# Patient Record
Sex: Male | Born: 1935 | Race: White | Hispanic: No | Marital: Married | Smoking: Former smoker
Health system: Southern US, Community
[De-identification: ages and names within clinical notes are randomized; demographics above are authoritative.]

## PROBLEM LIST (undated history)

## (undated) DIAGNOSIS — N400 Enlarged prostate without lower urinary tract symptoms: Secondary | ICD-10-CM

## (undated) DIAGNOSIS — E782 Mixed hyperlipidemia: Secondary | ICD-10-CM

## (undated) DIAGNOSIS — E039 Hypothyroidism, unspecified: Secondary | ICD-10-CM

## (undated) HISTORY — DX: Hypothyroidism, unspecified: E03.9

## (undated) HISTORY — DX: Benign prostatic hyperplasia without lower urinary tract symptoms: N40.0

## (undated) HISTORY — DX: Mixed hyperlipidemia: E78.2

---

## 2007-02-08 ENCOUNTER — Ambulatory Visit (HOSPITAL_COMMUNITY): Admission: RE | Admit: 2007-02-08 | Discharge: 2007-02-08 | Payer: Self-pay | Admitting: Oncology

## 2007-02-13 ENCOUNTER — Ambulatory Visit: Admission: RE | Admit: 2007-02-13 | Discharge: 2007-05-10 | Payer: Self-pay | Admitting: *Deleted

## 2007-03-07 LAB — CBC WITH DIFFERENTIAL/PLATELET
BASO%: 0.2 % (ref 0.0–2.0)
HCT: 35.2 % — ABNORMAL LOW (ref 38.7–49.9)
HGB: 12.8 g/dL — ABNORMAL LOW (ref 13.0–17.1)
MCHC: 36.3 g/dL — ABNORMAL HIGH (ref 32.0–35.9)
MONO#: 0.1 10*3/uL (ref 0.1–0.9)
NEUT%: 69.7 % (ref 40.0–75.0)
WBC: 4.3 10*3/uL (ref 4.0–10.0)
lymph#: 1.1 10*3/uL (ref 0.9–3.3)

## 2007-03-15 LAB — CBC WITH DIFFERENTIAL/PLATELET
Basophils Absolute: 0 10*3/uL (ref 0.0–0.1)
EOS%: 3.3 % (ref 0.0–7.0)
Eosinophils Absolute: 0 10*3/uL (ref 0.0–0.5)
HCT: 30.8 % — ABNORMAL LOW (ref 38.7–49.9)
HGB: 11.2 g/dL — ABNORMAL LOW (ref 13.0–17.1)
MCH: 33.7 pg — ABNORMAL HIGH (ref 28.0–33.4)
MONO#: 0.4 10*3/uL (ref 0.1–0.9)
NEUT#: 0.1 10*3/uL — CL (ref 1.5–6.5)
NEUT%: 9.3 % — ABNORMAL LOW (ref 40.0–75.0)
lymph#: 0.8 10*3/uL — ABNORMAL LOW (ref 0.9–3.3)

## 2007-03-24 ENCOUNTER — Ambulatory Visit: Payer: Self-pay | Admitting: Oncology

## 2007-04-04 LAB — CBC WITH DIFFERENTIAL/PLATELET
Basophils Absolute: 0 10*3/uL (ref 0.0–0.1)
EOS%: 0.5 % (ref 0.0–7.0)
Eosinophils Absolute: 0.1 10*3/uL (ref 0.0–0.5)
HCT: 28.8 % — ABNORMAL LOW (ref 38.7–49.9)
HGB: 10.1 g/dL — ABNORMAL LOW (ref 13.0–17.1)
LYMPH%: 5.9 % — ABNORMAL LOW (ref 14.0–48.0)
MCH: 33.9 pg — ABNORMAL HIGH (ref 28.0–33.4)
MCV: 96.3 fL (ref 81.6–98.0)
MONO%: 9.3 % (ref 0.0–13.0)
NEUT%: 84.3 % — ABNORMAL HIGH (ref 40.0–75.0)
Platelets: 135 10*3/uL — ABNORMAL LOW (ref 145–400)

## 2007-06-22 ENCOUNTER — Ambulatory Visit: Payer: Self-pay | Admitting: Oncology

## 2008-04-09 IMAGING — PT NM PET TUM IMG SKULL BASE T - THIGH
6 series · 25 of 25 positions shown · non-contrast
Comparison: None

CLINICAL DATA: Lung mass

FDG PET-CT TUMOR IMAGING (SKULL BASE TO THIGHS):
Fasting Blood Glucose:  94
TECHNIQUE: 17.4 mCi F-18 FDG was injected intravenously via the left
antecubital fossa .  Full-ring PET imaging was performed from the skull base
through the mid-thighs 55 minutes after injection.  CT data was obtained and
used for attenuation correction and anatomic localization only.  (This was not
acquired as a diagnostic CT examination.)

[Series 1: pet ac · axial · 3.3mm · 4.69mm/px · z∈[-888,-18]mm · 5 of 267 slices shown]
[im 1/267]
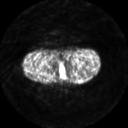
[im 67/267]
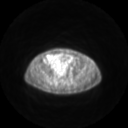
[im 134/267]
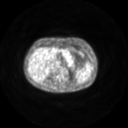
[im 200/267]
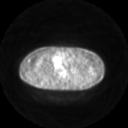
[im 267/267]
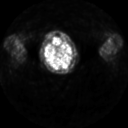

[Series 2: pet nac · axial · 3.3mm · 4.69mm/px · z∈[-888,-18]mm · 6 of 267 slices shown]
[im 1/267]
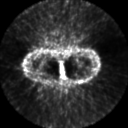
[im 54/267]
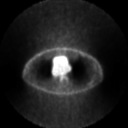
[im 107/267]
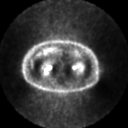
[im 160/267]
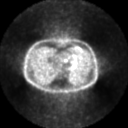
[im 213/267]
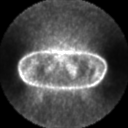
[im 267/267]
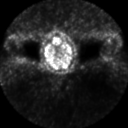

[Series 2: ct images · axial · 3.8mm · 0.98mm/px · z∈[-888,-18]mm · 6 of 267 slices shown]
[im 1/267]
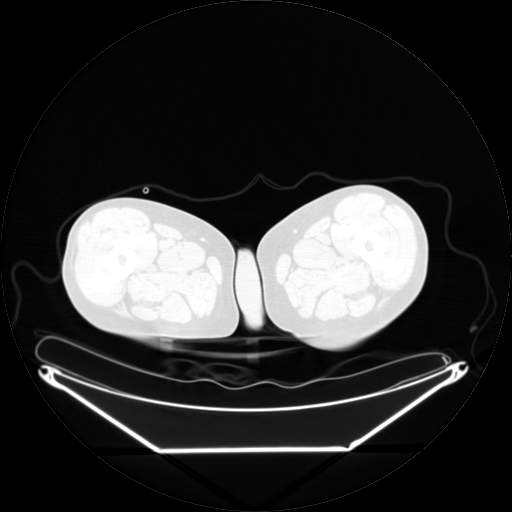
[im 54/267]
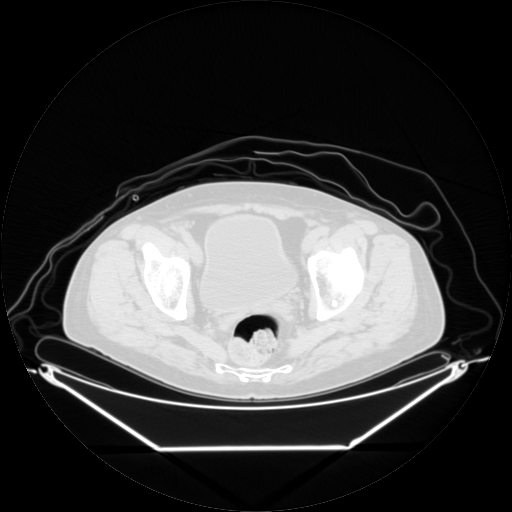
[im 107/267]
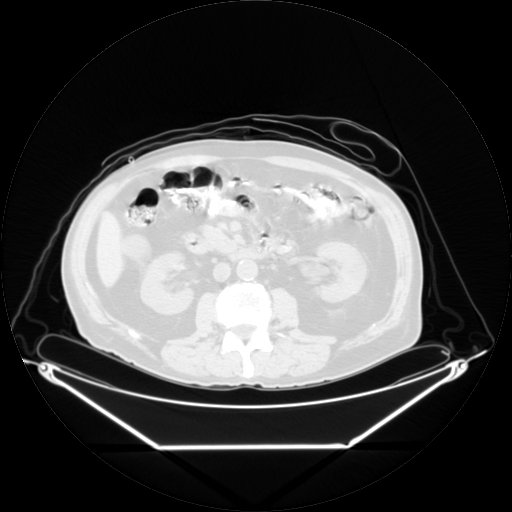
[im 160/267]
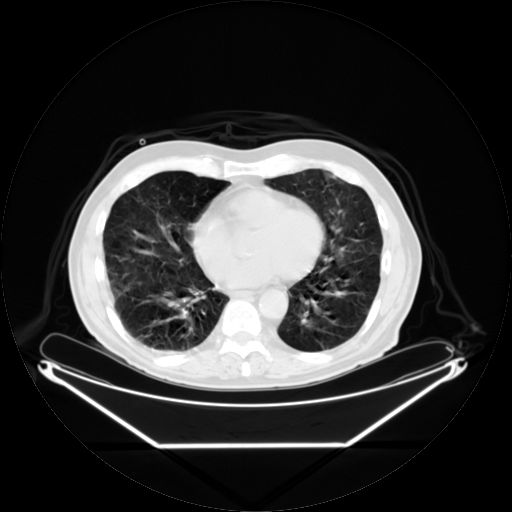
[im 213/267]
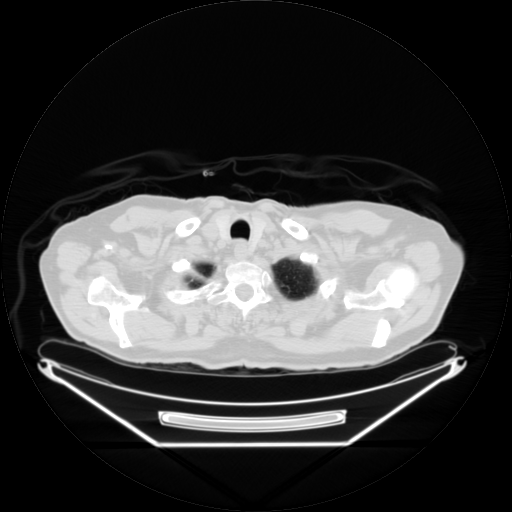
[im 267/267  brain]
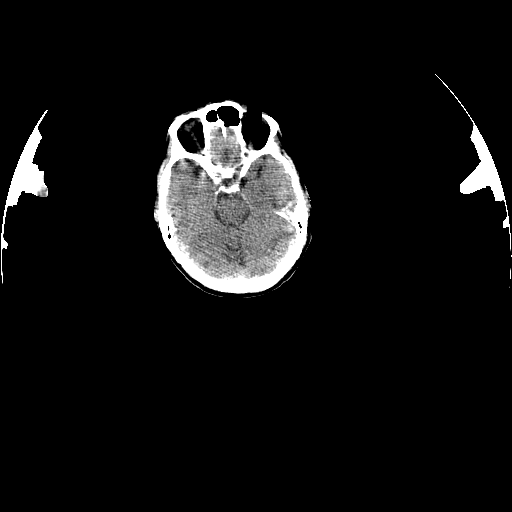

[Series 123: mip · coronal · 3.3mm · 4.69mm/px · 1 of 30 slices shown]
[im 1/30]
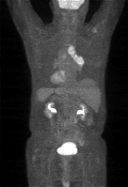

[Series 151: reformatted · axial · 3.3mm · 3.91mm/px · z∈[-888,-18]mm · 6 of 265 slices shown (1 of 2)]
[im 1/265]
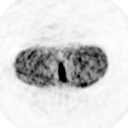
[im 53/265]
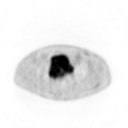
[im 106/265]
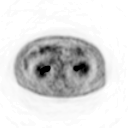
[im 159/265]
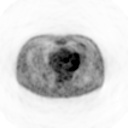
[im 212/265]
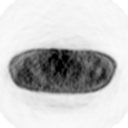
[im 265/265]
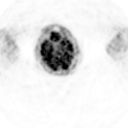

[Series 153: reformatted · coronal · 4.7mm · 6.98mm/px · 1 of 69 slices shown (2 of 2)]
[im 1/69]
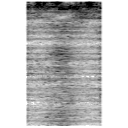

[25 of 25 positions shown; findings below may reference images not displayed]

FINDINGS: Right hilar mass measures approximately 2.5 x 4.2 cm, 85. This is an
SUV max equal to 8.3.

7 mm pulmonary nodule in the right upper lobe, image 79. This is too small to
characterize but.

Hypermetabolic precarinal and right peritracheal lymph nodes are noted. Right
paratracheal lymph node measures 3.2 x 3.0 cm, image 71. This is an SUV max
equal to 6.8. Hypermetabolic prevascular lymph node measures 1.2 cm, image 65.
This is SUV max equal to 3.9. 

Low level FDG uptake arises from left axillary lymph node with with an SUV max
equal to 1.8.

No abnormal FDG uptake is identified within the liver parenchyma. Focal areas of
low density within the liver parenchyma do not exhibit abnormal increased FDG
uptake and likely represent liver cysts.

Spleen negative.

Both adrenal glands are normal.

Pancreas normal.

Large fluid density masses arise from both kidneys. These likely represent
simple cyst but are incompletely characterize without IV contrast.

No hypermetabolic retrocrural or retroperitoneal lymph nodes.

No hypermetabolic pelvic or inguinal lymph nodes.

Review of the visualized osseous structures shows no abnormal foci of increased
FDG uptake within the axial and proximal appendicular skeleton.
IMPRESSION: 1. Right hilar mass is intensely hypermetabolic consistent with a primary lung
neoplasm. 
2. Hypermetabolic precarinal, right peritracheal and superior mediastinal lymph
node metastasis.
3. Mild nonspecific increased FDG uptake is identified within a single left
axillary lymph node. This may be reactive in etiology. Metastasis considered
less favored but not entirely excluded.
4.7 mm right upper lobe pulmonary nodule is nonspecific and 2 smaller laterally
characterized by PET.

## 2017-06-21 DIAGNOSIS — Z85118 Personal history of other malignant neoplasm of bronchus and lung: Secondary | ICD-10-CM | POA: Diagnosis not present

## 2018-06-21 DIAGNOSIS — Z85118 Personal history of other malignant neoplasm of bronchus and lung: Secondary | ICD-10-CM | POA: Diagnosis not present

## 2019-06-22 DIAGNOSIS — C3430 Malignant neoplasm of lower lobe, unspecified bronchus or lung: Secondary | ICD-10-CM

## 2019-06-22 DIAGNOSIS — Z85110 Personal history of malignant carcinoid tumor of bronchus and lung: Secondary | ICD-10-CM

## 2020-03-20 ENCOUNTER — Other Ambulatory Visit: Payer: Self-pay | Admitting: Legal Medicine

## 2020-05-07 ENCOUNTER — Encounter: Payer: Self-pay | Admitting: Legal Medicine

## 2020-05-07 ENCOUNTER — Ambulatory Visit (INDEPENDENT_AMBULATORY_CARE_PROVIDER_SITE_OTHER): Payer: Medicare Other | Admitting: Legal Medicine

## 2020-05-07 ENCOUNTER — Other Ambulatory Visit: Payer: Self-pay

## 2020-05-07 DIAGNOSIS — E038 Other specified hypothyroidism: Secondary | ICD-10-CM | POA: Diagnosis not present

## 2020-05-07 DIAGNOSIS — N400 Enlarged prostate without lower urinary tract symptoms: Secondary | ICD-10-CM

## 2020-05-07 DIAGNOSIS — E782 Mixed hyperlipidemia: Secondary | ICD-10-CM | POA: Diagnosis not present

## 2020-05-07 DIAGNOSIS — E039 Hypothyroidism, unspecified: Secondary | ICD-10-CM | POA: Insufficient documentation

## 2020-05-07 NOTE — Progress Notes (Signed)
Subjective:  Patient ID: Jesus Irwin, male    DOB: 1933-08-15  Age: 86 y.o. MRN: [id number]  Chief Complaint  Patient presents with  . Hypothyroidism  . Hyperlipidemia    HPI: Chronic visit.  Patient has HYPOTHYROIDISM.  Diagnosed 10 years ago.  Patient has stable thyroid readings.  Patient is having stable.  Last TSH was normal.  continue dosage of thyroid medicine.  Patient presents with hyperlipidemia.  Compliance with treatment has been good; patient takes medicines as directed, maintains low cholesterol diet, follows up as directed, and maintains exercise regimen.  Patient is using diet without problems.   Current Outpatient Medications on File Prior to Visit  Medication Sig Dispense Refill  . EUTHYROX 75 MCG tablet Take 1 tablet by mouth once daily 90 tablet 2   No current facility-administered medications on file prior to visit.   Past Medical History:  Diagnosis Date  . BPH (benign prostatic hyperplasia)   . Hypothyroidism   . Mixed hyperlipidemia    History reviewed. No pertinent surgical history.  Family History  Family history unknown: Yes   Social History   Socioeconomic History  . Marital status: Married    Spouse name: Not on file  . Number of children: Not on file  . Years of education: Not on file  . Highest education level: Not on file  Occupational History  . Not on file  Tobacco Use  . Smoking status: Former Smoker    Types: Cigarettes    Quit date: 2006    Years since quitting: 15.5  . Smokeless tobacco: Never Used  Vaping Use  . Vaping Use: Never used  Substance and Sexual Activity  . Alcohol use: Not Currently  . Drug use: Not on file  . Sexual activity: Not on file  Other Topics Concern  . Not on file  Social History Narrative  . Not on file   Social Determinants of Health   Financial Resource Strain:   . Difficulty of Paying Living Expenses:   Food Insecurity:   . Worried About Charity fundraiser in the Last Year:   . Arts development officer in the Last Year:   Transportation Needs:   . Film/video editor (Medical):   Marland Kitchen Lack of Transportation (Non-Medical):   Physical Activity:   . Days of Exercise per Week:   . Minutes of Exercise per Session:   Stress:   . Feeling of Stress :   Social Connections:   . Frequency of Communication with Friends and Family:   . Frequency of Social Gatherings with Friends and Family:   . Attends Religious Services:   . Active Member of Clubs or Organizations:   . Attends Archivist Meetings:   Marland Kitchen Marital Status:     Review of Systems  Constitutional: Negative.   HENT: Negative.   Eyes: Negative.   Respiratory: Negative.   Cardiovascular: Negative.   Gastrointestinal: Negative.   Musculoskeletal: Negative.   Skin: Negative.   Neurological: Negative.   Psychiatric/Behavioral: Negative.      Objective:  BP 124/70   Pulse 98   Temp (!) 97.5 F (36.4 C)   Resp 16   Ht 5\' 9"  (1.753 m)   Wt 150 lb 3.2 oz (68.1 kg)   SpO2 90%   BMI 22.18 kg/m   BP/Weight 7/82/9562  Systolic BP 130  Diastolic BP 70  Wt. (Lbs) 150.2  BMI 22.18    Physical Exam Vitals reviewed.  Constitutional:  Appearance: Normal appearance.  HENT:     Head: Normocephalic and atraumatic.     Right Ear: Tympanic membrane, ear canal and external ear normal.     Left Ear: Tympanic membrane, ear canal and external ear normal.     Nose: Nose normal.     Mouth/Throat:     Mouth: Mucous membranes are moist.     Pharynx: Oropharynx is clear.  Eyes:     Extraocular Movements: Extraocular movements intact.     Conjunctiva/sclera: Conjunctivae normal.     Pupils: Pupils are equal, round, and reactive to light.  Cardiovascular:     Rate and Rhythm: Normal rate and regular rhythm.     Pulses: Normal pulses.     Heart sounds: Normal heart sounds.  Pulmonary:     Effort: Pulmonary effort is normal.     Breath sounds: Normal breath sounds.  Abdominal:     General: Abdomen is flat.  Bowel sounds are normal.     Palpations: Abdomen is soft.  Musculoskeletal:        General: Normal range of motion.     Cervical back: Normal range of motion and neck supple.  Skin:    General: Skin is warm and dry.     Capillary Refill: Capillary refill takes less than 2 seconds.  Neurological:     General: No focal deficit present.     Mental Status: He is alert and oriented to person, place, and time.  Psychiatric:        Mood and Affect: Mood normal.        Behavior: Behavior normal.        Thought Content: Thought content normal.        Judgment: Judgment normal.     Diabetic Foot Exam - Simple   No data filed       Lab Results  Component Value Date   WBC 13.7 (H) 04/04/2007   HGB 10.1 (L) 04/04/2007   HCT 28.8 (L) 04/04/2007   PLT 135 (L) 04/04/2007      Assessment & Plan:   1. Other specified hypothyroidism - TSH Patient is known to have hypothyroidism and is on treatment with euthyroid.  Patient was diagnosed 10 years ago.  Other treatment includes none.  Patient is compliant with medicines and last TSH 6 months ago.  Last TSH was normal.  2. Mixed hyperlipidemia - CBC with Differential/Platelet - Comprehensive metabolic panel - Lipid panel AN INDIVIDUAL CARE PLAN for hyperlipidemia/ cholesterol was established and reinforced today.  The patient's status was assessed using clinical findings on exam, lab and other diagnostic tests. The patient's disease status was assessed based on evidence-based guidelines and found to be well controlled. MEDICATIONS were reviewed. SELF MANAGEMENT GOALS have been discussed and patient's success at attaining the goal of low cholesterol was assessed. RECOMMENDATION given include regular exercise 3 days a week and low cholesterol/low fat diet. CLINICAL SUMMARY including written plan to identify barriers unique to the patient due to social or economic  reasons was discussed. 3. Benign prostatic hyperplasia without lower urinary  tract symptoms AN INDIVIDUAL CARE PLAN for BPH was established and reinforced today.  The patient's status was assessed using clinical findings on exam, labs, and other diagnostic testing. Patient's success at meeting treatment goals based on disease specific evidence-bassed guidelines and found to be in good control. RECOMMENDATIONS include maintaining present medicines and treatment.      Orders Placed This Encounter  Procedures  . CBC with Differential/Platelet  .  Comprehensive metabolic panel  . Lipid panel  . TSH     Follow-up: Return in about 6 months (around 11/07/2020) for fasting, mcr pe kim.  An After Visit Summary was printed and given to the patient.  [place] [phone]

## 2020-05-08 LAB — CBC WITH DIFFERENTIAL/PLATELET
Basophils Absolute: 0 10*3/uL (ref 0.0–0.2)
Basos: 1 %
EOS (ABSOLUTE): 0.3 10*3/uL (ref 0.0–0.4)
Eos: 4 %
Hematocrit: 47.2 % (ref 37.5–51.0)
Hemoglobin: 16.6 g/dL (ref 13.0–17.7)
Immature Grans (Abs): 0 10*3/uL (ref 0.0–0.1)
Immature Granulocytes: 0 %
Lymphocytes Absolute: 1.3 10*3/uL (ref 0.7–3.1)
Lymphs: 19 %
MCH: 34.4 pg — ABNORMAL HIGH (ref 26.6–33.0)
MCHC: 35.2 g/dL (ref 31.5–35.7)
MCV: 98 fL — ABNORMAL HIGH (ref 79–97)
Monocytes Absolute: 0.6 10*3/uL (ref 0.1–0.9)
Monocytes: 9 %
Neutrophils Absolute: 4.6 10*3/uL (ref 1.4–7.0)
Neutrophils: 67 %
Platelets: 218 10*3/uL (ref 150–450)
RBC: 4.82 x10E6/uL (ref 4.14–5.80)
RDW: 12.6 % (ref 11.6–15.4)
WBC: 6.8 10*3/uL (ref 3.4–10.8)

## 2020-05-08 LAB — COMPREHENSIVE METABOLIC PANEL
ALT: 11 IU/L (ref 0–44)
AST: 21 IU/L (ref 0–40)
Albumin/Globulin Ratio: 1.5 (ref 1.2–2.2)
Albumin: 4.3 g/dL (ref 3.6–4.6)
Alkaline Phosphatase: 88 IU/L (ref 48–121)
BUN/Creatinine Ratio: 14 (ref 10–24)
BUN: 16 mg/dL (ref 8–27)
Bilirubin Total: 0.8 mg/dL (ref 0.0–1.2)
CO2: 27 mmol/L (ref 20–29)
Calcium: 9.5 mg/dL (ref 8.6–10.2)
Chloride: 98 mmol/L (ref 96–106)
Creatinine, Ser: 1.17 mg/dL (ref 0.76–1.27)
GFR calc Af Amer: 65 mL/min/{1.73_m2} (ref >59)
GFR calc non Af Amer: 57 mL/min/{1.73_m2} — ABNORMAL LOW (ref >59)
Globulin, Total: 2.8 g/dL (ref 1.5–4.5)
Glucose: 100 mg/dL — ABNORMAL HIGH (ref 65–99)
Potassium: 4.6 mmol/L (ref 3.5–5.2)
Sodium: 137 mmol/L (ref 134–144)
Total Protein: 7.1 g/dL (ref 6.0–8.5)

## 2020-05-08 LAB — TSH: TSH: 3.04 u[IU]/mL (ref 0.450–4.500)

## 2020-05-08 LAB — LIPID PANEL
Chol/HDL Ratio: 4.4 ratio (ref 0.0–5.0)
Cholesterol, Total: 224 mg/dL — ABNORMAL HIGH (ref 100–199)
HDL: 51 mg/dL (ref >39)
LDL Chol Calc (NIH): 152 mg/dL — ABNORMAL HIGH (ref 0–99)
Triglycerides: 117 mg/dL (ref 0–149)
VLDL Cholesterol Cal: 21 mg/dL (ref 5–40)

## 2020-05-08 LAB — CARDIOVASCULAR RISK ASSESSMENT

## 2020-05-08 NOTE — Progress Notes (Signed)
No anemia, glucose 100, kidney tests slightly low, LDL Cholesterol high 152, TSH 3.04 normal lp

## 2020-07-29 ENCOUNTER — Encounter: Payer: Self-pay | Admitting: Legal Medicine

## 2020-07-29 ENCOUNTER — Other Ambulatory Visit: Payer: Self-pay

## 2020-07-29 ENCOUNTER — Ambulatory Visit (INDEPENDENT_AMBULATORY_CARE_PROVIDER_SITE_OTHER): Payer: Medicare Other

## 2020-07-29 DIAGNOSIS — Z23 Encounter for immunization: Secondary | ICD-10-CM | POA: Diagnosis not present

## 2020-12-11 ENCOUNTER — Other Ambulatory Visit: Payer: Self-pay | Admitting: Legal Medicine

## 2020-12-11 DIAGNOSIS — E038 Other specified hypothyroidism: Secondary | ICD-10-CM

## 2021-06-04 ENCOUNTER — Ambulatory Visit (INDEPENDENT_AMBULATORY_CARE_PROVIDER_SITE_OTHER): Payer: Medicare Other | Admitting: Legal Medicine

## 2021-06-04 ENCOUNTER — Telehealth: Payer: Self-pay | Admitting: *Deleted

## 2021-06-04 ENCOUNTER — Encounter: Payer: Self-pay | Admitting: Legal Medicine

## 2021-06-04 ENCOUNTER — Other Ambulatory Visit: Payer: Self-pay

## 2021-06-04 VITALS — BP 130/72 | HR 91 | Temp 97.6°F | Ht 69.0 in | Wt 145.6 lb

## 2021-06-04 DIAGNOSIS — N400 Enlarged prostate without lower urinary tract symptoms: Secondary | ICD-10-CM

## 2021-06-04 DIAGNOSIS — Z85118 Personal history of other malignant neoplasm of bronchus and lung: Secondary | ICD-10-CM

## 2021-06-04 DIAGNOSIS — E038 Other specified hypothyroidism: Secondary | ICD-10-CM

## 2021-06-04 DIAGNOSIS — E782 Mixed hyperlipidemia: Secondary | ICD-10-CM | POA: Diagnosis not present

## 2021-06-04 NOTE — Progress Notes (Signed)
Established Patient Office Visit  Subjective:  Patient ID: Jesus Irwin, male    DOB: May 29, 1933  Age: 87 y.o. MRN: [id number]  CC:  Chief Complaint  Patient presents with   Hyperlipidemia   Hypothyroidism    HPI Jesus Irwin presents for chronic visit  Patient has HYPOTHYROIDISM.  Diagnosed 20 years ago.  Patient has stable thyroid readings.  Patient is having nosymptoms.  Last TSH was normal.  continue dosage of thyroid medicine.   Patient presents with hyperlipidemia.  Compliance with treatment has been good; patient takes medicines as directed, maintains low cholesterol diet, follows up as directed, and maintains exercise regimen.  Patient is using none without problems.   Past Medical History:  Diagnosis Date   BPH (benign prostatic hyperplasia)    Hypothyroidism    Mixed hyperlipidemia     History reviewed. No pertinent surgical history.  Family History  Family history unknown: Yes    Social History   Socioeconomic History   Marital status: Married    Spouse name: Not on file   Number of children: Not on file   Years of education: Not on file   Highest education level: Not on file  Occupational History   Not on file  Tobacco Use   Smoking status: Former    Types: Cigarettes    Quit date: 2006    Years since quitting: 16.6   Smokeless tobacco: Never  Vaping Use   Vaping Use: Never used  Substance and Sexual Activity   Alcohol use: Not Currently   Drug use: Not on file   Sexual activity: Not on file  Other Topics Concern   Not on file  Social History Narrative   Not on file   Social Determinants of Health   Financial Resource Strain: Not on file  Food Insecurity: Not on file  Transportation Needs: Not on file  Physical Activity: Not on file  Stress: Not on file  Social Connections: Not on file  Intimate Partner Violence: Not on file    Outpatient Medications Prior to Visit  Medication Sig Dispense Refill   EUTHYROX 75 MCG tablet Take 1  tablet by mouth once daily 90 tablet 2   No facility-administered medications prior to visit.    Allergies  Allergen Reactions   Penicillins     ROS Review of Systems  Constitutional:  Negative for chills, fatigue and fever.  HENT:  Positive for hearing loss. Negative for congestion, ear pain and sore throat.   Eyes:  Negative for visual disturbance.  Respiratory:  Negative for cough and shortness of breath.   Cardiovascular:  Negative for chest pain, palpitations and leg swelling.  Gastrointestinal:  Negative for abdominal pain, constipation, diarrhea, nausea and vomiting.  Endocrine: Negative for polydipsia, polyphagia and polyuria.  Genitourinary:  Positive for decreased urine volume. Negative for dysuria and frequency.  Musculoskeletal:  Negative for arthralgias and myalgias.  Skin: Negative.   Neurological:  Negative for dizziness and headaches.  Psychiatric/Behavioral:  Negative for dysphoric mood.        No dysphoria     Objective:    Physical Exam Vitals reviewed.  Constitutional:      General: He is in acute distress.     Appearance: Normal appearance.  HENT:     Head: Normocephalic.     Right Ear: Tympanic membrane, ear canal and external ear normal.     Left Ear: Tympanic membrane, ear canal and external ear normal.     Mouth/Throat:  Mouth: Mucous membranes are moist.     Pharynx: Oropharynx is clear.  Eyes:     Extraocular Movements: Extraocular movements intact.     Conjunctiva/sclera: Conjunctivae normal.     Pupils: Pupils are equal, round, and reactive to light.  Cardiovascular:     Rate and Rhythm: Normal rate and regular rhythm.     Pulses: Normal pulses.     Heart sounds: Normal heart sounds. No murmur heard.   No gallop.  Pulmonary:     Effort: Pulmonary effort is normal. No respiratory distress.     Breath sounds: Normal breath sounds. No wheezing.  Abdominal:     General: Abdomen is flat. Bowel sounds are normal. There is no distension.      Palpations: Abdomen is soft.     Tenderness: There is no abdominal tenderness.  Musculoskeletal:        General: Normal range of motion.     Cervical back: Normal range of motion.     Right lower leg: No edema.     Left lower leg: No edema.  Skin:    General: Skin is warm and dry.     Capillary Refill: Capillary refill takes less than 2 seconds.  Neurological:     General: No focal deficit present.     Mental Status: He is alert and oriented to person, place, and time. Mental status is at baseline.  Psychiatric:        Mood and Affect: Mood normal.        Thought Content: Thought content normal.    BP 130/72   Pulse 91   Temp 97.6 F (36.4 C)   Ht 5\' 9"  (1.753 m)   Wt 145 lb 9.6 oz (66 kg)   SpO2 94%   BMI 21.50 kg/m  Wt Readings from Last 3 Encounters:  06/04/21 145 lb 9.6 oz (66 kg)  05/07/20 150 lb 3.2 oz (68.1 kg)     Health Maintenance Due  Topic Date Due   COVID-19 Vaccine (1) Never done   TETANUS/TDAP  Never done   Zoster Vaccines- Shingrix (1 of 2) Never done   PNA vac Low Risk Adult (1 of 2 - PCV13) Never done   INFLUENZA VACCINE  05/18/2021    There are no preventive care reminders to display for this patient.  Lab Results  Component Value Date   TSH 3.040 05/07/2020   Lab Results  Component Value Date   WBC 6.8 05/07/2020   HGB 16.6 05/07/2020   HCT 47.2 05/07/2020   MCV 98 (H) 05/07/2020   PLT 218 05/07/2020   Lab Results  Component Value Date   NA 137 05/07/2020   K 4.6 05/07/2020   CO2 27 05/07/2020   GLUCOSE 100 (H) 05/07/2020   BUN 16 05/07/2020   CREATININE 1.17 05/07/2020   BILITOT 0.8 05/07/2020   ALKPHOS 88 05/07/2020   AST 21 05/07/2020   ALT 11 05/07/2020   PROT 7.1 05/07/2020   ALBUMIN 4.3 05/07/2020   CALCIUM 9.5 05/07/2020   Lab Results  Component Value Date   CHOL 224 (H) 05/07/2020   Lab Results  Component Value Date   HDL 51 05/07/2020   Lab Results  Component Value Date   LDLCALC 152 (H) 05/07/2020    Lab Results  Component Value Date   TRIG 117 05/07/2020   Lab Results  Component Value Date   CHOLHDL 4.4 05/07/2020   No results found for: HGBA1C    Assessment & Plan:  Problem List Items Addressed This Visit       Endocrine   Hypothyroidism   Relevant Orders   Comprehensive metabolic panel   TSH   AMB Referral to [facility] Patient is known to have hypothyroidism and is 0n treatment with levothyroxine 14mcg.  Patient was diagnosed 10 years ago.  Other treatment includes none.  Patient is compliant with medicines and last TSH 6 months ago.  Last TSH was normal.      Genitourinary   BPH (benign prostatic hyperplasia)   Relevant Orders   AMB Referral to [facility] for BPH was established and reinforced today.  The patient's status was assessed using clinical findings on exam, labs, and other diagnostic testing. Patient's success at meeting treatment goals based on disease specific evidence-bassed guidelines and found to be in good control. RECOMMENDATIONS include maintaining present medicines and treatment.      Other   Mixed hyperlipidemia - Primary   Relevant Orders   Comprehensive metabolic panel   Lipid panel   AMB Referral to [facility] for hyperlipidemia/ cholesterol was established and reinforced today.  The patient's status was assessed using clinical findings on exam, lab and other diagnostic tests. The patient's disease status was assessed based on evidence-based guidelines and found to be fair controlled. MEDICATIONS were reviewed. SELF MANAGEMENT GOALS have been discussed and patient's success at attaining the goal of low cholesterol was assessed. RECOMMENDATION given include regular exercise 3 days a week and low cholesterol/low fat diet. CLINICAL SUMMARY including written plan to identify barriers unique to the patient due to social or economic  reasons was  discussed.    Other Visit Diagnoses     History of lung cancer       Relevant Orders   AMB Referral to [facility] Patient has had no recurrance         Follow-up: Return in about 6 months (around 12/05/2021) for fasting.    Reinaldo Meeker, MD

## 2021-06-04 NOTE — Chronic Care Management (AMB) (Signed)
  Chronic Care Management   Note  06/04/2021 Name: Jesus Irwin MRN: [id number] DOB: Feb 13, 1933  Jesus Irwin is a 87 y.o. year old male who is a primary care patient of Lillard Anes, MD. I reached out to [facility] by phone today in response to a referral sent by Jesus Irwin's PCP, Dr. Henrene Pastor.      [name] was given information about Chronic Care Management services today including:  CCM service includes personalized support from designated clinical staff supervised by his physician, including individualized plan of care and coordination with other care providers 24/7 contact phone numbers for assistance for urgent and routine care needs. Service will only be billed when office clinical staff spend 20 minutes or more in a month to coordinate care. Only one practitioner may furnish and bill the service in a calendar month. The patient may stop CCM services at any time (effective at the end of the month) by phone call to the office staff. The patient will be responsible for cost sharing (co-pay) of up to 20% of the service fee (after annual deductible is met).  Patient agreed to services and verbal consent obtained.   Follow up plan: Telephone appointment with care management team member scheduled for: 06/16/21   [facility]  Direct Dial: [phone]

## 2021-06-05 LAB — LIPID PANEL
Chol/HDL Ratio: 3.5 ratio (ref 0.0–5.0)
Cholesterol, Total: 228 mg/dL — ABNORMAL HIGH (ref 100–199)
HDL: 65 mg/dL (ref >39)
LDL Chol Calc (NIH): 142 mg/dL — ABNORMAL HIGH (ref 0–99)
Triglycerides: 122 mg/dL (ref 0–149)
VLDL Cholesterol Cal: 21 mg/dL (ref 5–40)

## 2021-06-05 LAB — COMPREHENSIVE METABOLIC PANEL
ALT: 13 IU/L (ref 0–44)
AST: 21 IU/L (ref 0–40)
Albumin/Globulin Ratio: 1.8 (ref 1.2–2.2)
Albumin: 4.4 g/dL (ref 3.6–4.6)
Alkaline Phosphatase: 78 IU/L (ref 44–121)
BUN/Creatinine Ratio: 14 (ref 10–24)
BUN: 19 mg/dL (ref 8–27)
Bilirubin Total: 0.7 mg/dL (ref 0.0–1.2)
CO2: 27 mmol/L (ref 20–29)
Calcium: 9.4 mg/dL (ref 8.6–10.2)
Chloride: 96 mmol/L (ref 96–106)
Creatinine, Ser: 1.33 mg/dL — ABNORMAL HIGH (ref 0.76–1.27)
Globulin, Total: 2.4 g/dL (ref 1.5–4.5)
Glucose: 110 mg/dL — ABNORMAL HIGH (ref 65–99)
Potassium: 4.7 mmol/L (ref 3.5–5.2)
Sodium: 141 mmol/L (ref 134–144)
Total Protein: 6.8 g/dL (ref 6.0–8.5)
eGFR: 52 mL/min/{1.73_m2} — ABNORMAL LOW (ref >59)

## 2021-06-05 LAB — CARDIOVASCULAR RISK ASSESSMENT

## 2021-06-05 LAB — TSH: TSH: 3.14 u[IU]/mL (ref 0.450–4.500)

## 2021-06-05 NOTE — Progress Notes (Signed)
Glucose 110, kidney test stage 3a -mild, liver tests normal, LDL cholesterol 142 high, TSH 2.14 normal,  lp

## 2021-06-16 ENCOUNTER — Ambulatory Visit (INDEPENDENT_AMBULATORY_CARE_PROVIDER_SITE_OTHER): Payer: Medicare Other

## 2021-06-16 VITALS — Ht 69.0 in | Wt 145.0 lb

## 2021-06-16 DIAGNOSIS — Z85118 Personal history of other malignant neoplasm of bronchus and lung: Secondary | ICD-10-CM

## 2021-06-16 DIAGNOSIS — N401 Enlarged prostate with lower urinary tract symptoms: Secondary | ICD-10-CM | POA: Diagnosis not present

## 2021-06-16 DIAGNOSIS — R3912 Poor urinary stream: Secondary | ICD-10-CM

## 2021-06-16 DIAGNOSIS — H3530 Unspecified macular degeneration: Secondary | ICD-10-CM | POA: Insufficient documentation

## 2021-06-16 DIAGNOSIS — E038 Other specified hypothyroidism: Secondary | ICD-10-CM

## 2021-06-16 NOTE — Patient Instructions (Signed)
Visit Information   PATIENT GOALS:   Goals Addressed             This Visit's Progress    Monitor for and changes in thyroid or prostate       Timeframe:  Long-Range Goal Priority:  Medium Start Date:      06/16/2021                       Expected End Date:                        Follow Up Date 08/11/2021   - call Dr. Henrene Pastor for any changes -take medications as prescribed. -recognize symptoms of an urinary track infection.   Why is this important?   The best way to learn about your health and care is by talking to the doctor and nurse.  They will answer your questions and give you information in the way that you like best.    Notes:      Observation of new symtoms related to breathing and history oif lung cancer,       Timeframe:  Long-Range Goal Priority:  Medium Start Date:            06/16/2021                 Expected End Date:       06/16/2022                Follow Up Date 08/11/2021   - eat healthy - get at least 8 hours of sleep at night - get outdoors every day (weather permitting) - maintain a healthy weight    Why is this important?   Cancer treatment and its side effects can drain your energy. It can keep you from doing things you would like to do.  There are many things that can be done to manage fatigue.    Notes:         Consent to CCM Services: Jesus Irwin was given information about Chronic Care Management services including:  CCM service includes personalized support from designated clinical staff supervised by his physician, including individualized plan of care and coordination with other care providers 24/7 contact phone numbers for assistance for urgent and routine care needs. Service will only be billed when office clinical staff spend 20 minutes or more in a month to coordinate care. Only one practitioner may furnish and bill the service in a calendar month. The patient may stop CCM services at any time (effective at the end of the month)  by phone call to the office staff. The patient will be responsible for cost sharing (co-pay) of up to 20% of the service fee (after annual deductible is met).  Patient agreed to services and verbal consent obtained.   The patient verbalized understanding of instructions, educational materials, and care plan provided today and agreed to receive a mailed copy of patient instructions, educational materials, and care plan.   Telephone follow up appointment with care management team member scheduled for: 08/11/2021  Tomasa Rand RN, BSN, CEN RN Case Manager - [facility] Mobile: [phone]   CLINICAL CARE PLAN: Patient Care Plan: Hypothroidism/ BPH     Problem Identified: Be observant for changes in condition   Priority: Medium  Onset Date: 06/16/2021  Note:   Current Barriers:  Ineffective Self Health Maintenance in a patient with  BPH, hypothyroidism  Clinical Goal(s):  Collaboration with Lillard Anes, MD regarding development and update of comprehensive plan of care as evidenced by provider attestation and co-signature Inter-disciplinary care team collaboration (see longitudinal plan of care) patient will work with care management team to address care coordination and chronic disease management needs related to Disease Management   Interventions:  Evaluation of current treatment plan related to  hypothyroidism and BPH self-management and patient's adherence to plan as established by provider. Collaboration with Lillard Anes, MD regarding development and update of comprehensive plan of care as evidenced by provider attestation       and co-signature Inter-disciplinary care team collaboration (see longitudinal plan of care) Discussed plans with patient for ongoing care management follow up and provided patient with direct contact information for care management team Reviewed signs and symtoms of UTI. Encouraged patient to call MD  for changes in appetite or weight loss Encouraged patient to call me if needed. Provided my contact information. Self Care Activities:  Self administers medications as prescribed Attends all scheduled provider appointments Calls provider office for new concerns or questions Patient Goals:  call Dr. Henrene Pastor for any changes -take medications as prescribed. -recognize symptoms of an urinary track infection.  Follow Up Plan: Telephone follow up appointment with care management team member scheduled for: 08/11/2021    Patient Care Plan: Cancer Posttreatment Phase (Adult)     Problem Identified: Disease Recurrence      Long-Range Goal: Be observant for new symptoms or recurring symtoms of cancer   Start Date: 06/16/2021  This Visit's Progress: On track  Priority: Medium  Note:   Current Barriers:  Ineffective Self Health Maintenance in a patient with  history of lung cancer/ reports not currently having any follow up since Jesus Irwin):  Collaboration with Lillard Anes, MD regarding development and update of comprehensive plan of care as evidenced by provider attestation and co-signature Inter-disciplinary care team collaboration (see longitudinal plan of care) patient will work with care management team to address care coordination and chronic disease management needs related to Disease Management   Interventions:  Evaluation of current treatment plan related to  hypothyroidism and BPH, history of lung cancer , self-management and patient's adherence to plan as established by provider. Collaboration with Lillard Anes, MD regarding development and update of comprehensive plan of care as evidenced by provider attestation       and co-signature Inter-disciplinary care team collaboration (see longitudinal plan of care) Discussed plans with patient for ongoing care management follow up and provided patient with direct contact information for care management  team healthy lifestyle promoted reassurance provided survivorship plan reviewed or updated Encouraged advanced directives- patient declined. Encouraged patient to identify and alternate person in family to call in case of an emergency and wife is not available. Reviewed with patient to discuss his wishes with his family since he is not interested in advanced directives.  Self Care Activities:  Patient verbalizes understanding of plan to call MD with any new shortness of breath, coughing up blood or weight loss. Attends all scheduled provider appointments Calls provider office for new concerns or questions Patient Goals: - eat healthy - get at least 8 hours of sleep at night - get outdoors every day (weather permitting) - maintain a healthy weight  Follow Up Plan: Telephone follow up appointment with care management team member scheduled for:  08/11/2021

## 2021-06-16 NOTE — Chronic Care Management (AMB) (Signed)
Chronic Care Management   CCM RN Visit Note  06/16/2021 Name: Jesus Irwin MRN: [id number] DOB: 17-Oct-1933  Subjective: Jesus Irwin is a 87 y.o. year old male who is a primary care patient of Lillard Anes, MD. The care management team was consulted for assistance with disease management and care coordination needs.    Engaged with patient by telephone for initial visit in response to provider referral for case management and/or care coordination services.   Consent to Services:  The patient was given the following information about Chronic Care Management services today, agreed to services, and gave verbal consent: 1. CCM service includes personalized support from designated clinical staff supervised by the primary care provider, including individualized plan of care and coordination with other care providers 2. 24/7 contact phone numbers for assistance for urgent and routine care needs. 3. Service will only be billed when office clinical staff spend 20 minutes or more in a month to coordinate care. 4. Only one practitioner may furnish and bill the service in a calendar month. 5.The patient may stop CCM services at any time (effective at the end of the month) by phone call to the office staff. 6. The patient will be responsible for cost sharing (co-pay) of up to 20% of the service fee (after annual deductible is met). Patient agreed to services and consent obtained.  Patient agreed to services and verbal consent obtained.   Assessment: Review of patient past medical history, allergies, medications, health status, including review of consultants reports, laboratory and other test data, was performed as part of comprehensive evaluation and provision of chronic care management services.   SDOH (Social Determinants of Health) assessments and interventions performed:  SDOH Interventions    Flowsheet Row Most Recent Value  SDOH Interventions   Food Insecurity Interventions  Intervention Not Indicated  Housing Interventions Intervention Not Indicated  Intimate Partner Violence Interventions Intervention Not Indicated  Physical Activity Interventions Intervention Not Indicated  [mows own grass and piddles around house.]  Transportation Interventions Intervention Not Indicated        CCM Care Plan  Allergies  Allergen Reactions   Penicillins     Outpatient Encounter Medications as of 06/16/2021  Medication Sig   Ascorbic Acid (VITAMIN C) 1000 MG tablet Take 1,000 mg by mouth daily.   b complex vitamins capsule Take 1 capsule by mouth daily.   Bilberry 100 MG CAPS Take 1 tablet by mouth daily.   Cholecalciferol (VITAMIN D) 50 MCG (2000 UT) CAPS Take 1 tablet by mouth daily.   EUTHYROX 75 MCG tablet Take 1 tablet by mouth once daily   IRON, FERROUS SULFATE, PO Take 1 tablet by mouth daily. Reports taking veggie iron   Lutein 20 MG CAPS Take 1 tablet by mouth daily.   MULTIPLE VITAMIN PO Take 1 tablet by mouth daily.   Omega-3 Fatty Acids (FISH OIL PO) Take 1,500 capsules by mouth 2 (two) times daily.   Vitamin E 450 MG (1000 UT) CAPS Take 1 tablet by mouth daily.   No facility-administered encounter medications on file as of 06/16/2021.    Patient Active Problem List   Diagnosis Date Noted   History of lung cancer 06/16/2021   Macular degeneration 06/16/2021   Hypothyroidism    Mixed hyperlipidemia    BPH (benign prostatic hyperplasia)     Conditions to be addressed/monitored: hypothyroidism, post lung cancer, BPH  Care Plan : Hypothroidism/ BPH  Updates made by Thana Ates, RN since 06/16/2021 12:00 AM  Problem: Be observant for changes in condition   Priority: Medium  Onset Date: 06/16/2021  Note:   Current Barriers:  Ineffective Self Health Maintenance in a patient with  BPH, hypothyroidism  Clinical Goal(s):  Collaboration with Lillard Anes, MD regarding development and update of comprehensive plan of care as evidenced  by provider attestation and co-signature Inter-disciplinary care team collaboration (see longitudinal plan of care) patient will work with care management team to address care coordination and chronic disease management needs related to Disease Management   Interventions:  Evaluation of current treatment plan related to  hypothyroidism and BPH self-management and patient's adherence to plan as established by provider. Collaboration with Lillard Anes, MD regarding development and update of comprehensive plan of care as evidenced by provider attestation       and co-signature Inter-disciplinary care team collaboration (see longitudinal plan of care) Discussed plans with patient for ongoing care management follow up and provided patient with direct contact information for care management team Reviewed signs and symtoms of UTI. Encouraged patient to call MD for changes in appetite or weight loss Encouraged patient to call me if needed. Provided my contact information. Self Care Activities:  Self administers medications as prescribed Attends all scheduled provider appointments Calls provider office for new concerns or questions Patient Goals:  call Dr. Henrene Pastor for any changes -take medications as prescribed. -recognize symptoms of an urinary track infection.  Follow Up Plan: Telephone follow up appointment with care management team member scheduled for: 08/11/2021    Care Plan : Cancer Posttreatment Phase (Adult)  Updates made by Thana Ates, RN since 06/16/2021 12:00 AM     Problem: Disease Recurrence      Long-Range Goal: Be observant for new symptoms or recurring symtoms of cancer   Start Date: 06/16/2021  This Visit's Progress: On track  Priority: Medium  Note:   Current Barriers:  Ineffective Self Health Maintenance in a patient with  history of lung cancer/ reports not currently having any follow up since [place]):  Collaboration with Lillard Anes, MD regarding development and update of comprehensive plan of care as evidenced by provider attestation and co-signature Inter-disciplinary care team collaboration (see longitudinal plan of care) patient will work with care management team to address care coordination and chronic disease management needs related to Disease Management   Interventions:  Evaluation of current treatment plan related to  hypothyroidism and BPH, history of lung cancer , self-management and patient's adherence to plan as established by provider. Collaboration with Lillard Anes, MD regarding development and update of comprehensive plan of care as evidenced by provider attestation       and co-signature Inter-disciplinary care team collaboration (see longitudinal plan of care) Discussed plans with patient for ongoing care management follow up and provided patient with direct contact information for care management team healthy lifestyle promoted reassurance provided survivorship plan reviewed or updated Encouraged advanced directives- patient declined. Encouraged patient to identify and alternate person in family to call in case of an emergency and wife is not available. Reviewed with patient to discuss his wishes with his family since he is not interested in advanced directives.  Self Care Activities:  Patient verbalizes understanding of plan to call MD with any new shortness of breath, coughing up blood or weight loss. Attends all scheduled provider appointments Calls provider office for new concerns or questions Patient Goals: - eat healthy - get at least 8 hours of sleep at night -  get outdoors every day (weather permitting) - maintain a healthy weight  Follow Up Plan: Telephone follow up appointment with care management team member scheduled for:  08/11/2021     Plan:Telephone follow up appointment with care management team member scheduled for:  08/11/2021  Tomasa Rand RN, BSN, CEN RN  Case Manager - [facility] [employer] Mobile: [phone]

## 2021-08-05 ENCOUNTER — Ambulatory Visit (INDEPENDENT_AMBULATORY_CARE_PROVIDER_SITE_OTHER): Payer: Medicare Other

## 2021-08-05 ENCOUNTER — Other Ambulatory Visit: Payer: Self-pay

## 2021-08-05 DIAGNOSIS — Z23 Encounter for immunization: Secondary | ICD-10-CM | POA: Diagnosis not present

## 2021-08-11 ENCOUNTER — Ambulatory Visit (INDEPENDENT_AMBULATORY_CARE_PROVIDER_SITE_OTHER): Payer: Medicare Other

## 2021-08-11 VITALS — Wt 145.0 lb

## 2021-08-11 DIAGNOSIS — N400 Enlarged prostate without lower urinary tract symptoms: Secondary | ICD-10-CM

## 2021-08-11 DIAGNOSIS — Z85118 Personal history of other malignant neoplasm of bronchus and lung: Secondary | ICD-10-CM

## 2021-08-11 NOTE — Chronic Care Management (AMB) (Signed)
Chronic Care Management   CCM RN Visit Note  08/11/2021 Name: Jesus Irwin MRN: [id number] DOB: [birth year]  Subjective: Jesus Irwin is a 87 y.o. year old male who is a primary care patient of Lillard Anes, MD. The care management team was consulted for assistance with disease management and care coordination needs.    Engaged with patient by telephone for follow up visit in response to provider referral for case management and/or care coordination services.   Consent to Services:  The patient was given information about Chronic Care Management services, agreed to services, and gave verbal consent prior to initiation of services.  Please see initial visit note for detailed documentation.   Patient agreed to services and verbal consent obtained.   Assessment: Review of patient past medical history, allergies, medications, health status, including review of consultants reports, laboratory and other test data, was performed as part of comprehensive evaluation and provision of chronic care management services.   SDOH (Social Determinants of Health) assessments and interventions performed:    CCM Care Plan  Allergies  Allergen Reactions   Penicillins     Outpatient Encounter Medications as of 08/11/2021  Medication Sig   Ascorbic Acid (VITAMIN C) 1000 MG tablet Take 1,000 mg by mouth daily.   b complex vitamins capsule Take 1 capsule by mouth daily.   Bilberry 100 MG CAPS Take 1 tablet by mouth daily.   Cholecalciferol (VITAMIN D) 50 MCG (2000 UT) CAPS Take 1 tablet by mouth daily.   EUTHYROX 75 MCG tablet Take 1 tablet by mouth once daily   IRON, FERROUS SULFATE, PO Take 1 tablet by mouth daily. Reports taking veggie iron   Lutein 20 MG CAPS Take 1 tablet by mouth daily.   MULTIPLE VITAMIN PO Take 1 tablet by mouth daily.   Omega-3 Fatty Acids (FISH OIL PO) Take 1,500 capsules by mouth 2 (two) times daily.   Vitamin E 450 MG (1000 UT) CAPS Take 1 tablet by mouth  daily.   No facility-administered encounter medications on file as of 08/11/2021.    Patient Active Problem List   Diagnosis Date Noted   History of lung cancer 06/16/2021   Macular degeneration 06/16/2021   Hypothyroidism    Mixed hyperlipidemia    BPH (benign prostatic hyperplasia)     Conditions to be addressed/monitored:BPH and post lung cancer  Care Plan : Hypothroidism/ BPH  Updates made by Thana Ates, RN since 08/11/2021 12:00 AM     Problem: Be observant for changes in condition   Priority: Medium  Onset Date: 06/16/2021  Note:   Current Barriers:  Ineffective Self Health Maintenance in a patient with  BPH, hypothyroidism  Patient denies any recent UTIs,  reports urine stream is about the same.  Reports taking all medications as prescribed and no recent changes in medications. Reports no recent falls or hospitalizations. Reports weight of 145 pounds  Clinical Goal(s):  Collaboration with Lillard Anes, MD regarding development and update of comprehensive plan of care as evidenced by provider attestation and co-signature Inter-disciplinary care team collaboration (see longitudinal plan of care) patient will work with care management team to address care coordination and chronic disease management needs related to Disease Management   Interventions:  Evaluation of current treatment plan related to  hypothyroidism and BPH self-management and patient's adherence to plan as established by provider. Collaboration with Lillard Anes, MD regarding development and update of comprehensive plan of care as evidenced by provider attestation  and co-signature Inter-disciplinary care team collaboration (see longitudinal plan of care) Discussed plans with patient for ongoing care management follow up and provided patient with direct contact information for care management team Reviewed signs and symtoms of UTI. Encouraged patient to call MD for changes in  appetite or weight loss Encouraged patient to call me if needed. Provided my contact information. Self Care Activities:  Self administers medications as prescribed Attends all scheduled provider appointments Calls provider office for new concerns or questions Patient Goals:  - call Dr. Henrene Pastor for any changes - take medications as prescribed. - recognize symptoms of an urinary track infection.  Follow Up Plan: Telephone follow up appointment with care management team member scheduled for: 11/10/2021    Care Plan : Cancer Posttreatment Phase (Adult)  Updates made by Thana Ates, RN since 08/11/2021 12:00 AM     Problem: Disease Recurrence      Long-Range Goal: Be observant for new symptoms or recurring symtoms of cancer   Start Date: 06/16/2021  Recent Progress: On track  Priority: Medium  Note:   Current Barriers:  Ineffective Self Health Maintenance in a patient with  history of lung cancer/ reports not currently having any follow up since COVID  Reports today that he is no longer required to have cancer follow up. Clinical Goal(s):  Collaboration with Lillard Anes, MD regarding development and update of comprehensive plan of care as evidenced by provider attestation and co-signature Inter-disciplinary care team collaboration (see longitudinal plan of care) patient will work with care management team to address care coordination and chronic disease management needs related to Disease Management   Interventions:  Evaluation of current treatment plan related to  hypothyroidism and BPH, history of lung cancer , self-management and patient's adherence to plan as established by provider. Collaboration with Lillard Anes, MD regarding development and update of comprehensive plan of care as evidenced by provider attestation       and co-signature Inter-disciplinary care team collaboration (see longitudinal plan of care) Discussed plans with patient for ongoing care  management follow up and provided patient with direct contact information for care management team healthy lifestyle promoted reassurance provided survivorship plan reviewed or updated Encouraged advanced directives- patient declined. Encouraged patient to identify and alternate person in family to call in case of an emergency and wife is not available. Reviewed with patient to discuss his wishes with his family since he is not interested in advanced directives.  Encouraged patient to call MD for any changes in condition or any new problems. Self Care Activities:  Patient verbalizes understanding of plan to call MD with any new shortness of breath, coughing up blood or weight loss. Attends all scheduled provider appointments Calls provider office for new concerns or questions Patient Goals: - eat healthy - get at least 8 hours of sleep at night - get outdoors every day (weather permitting) - maintain a healthy weight  Follow Up Plan: Telephone follow up appointment with care management team member scheduled for:  11/10/2021     Plan:Telephone follow up appointment with care management team member scheduled for:  11/10/2021 Tomasa Rand RN, BSN, CEN RN Case Manager - [facility] [employer] Mobile: [phone]

## 2021-08-11 NOTE — Patient Instructions (Signed)
Visit Information  PATIENT GOALS:  Goals Addressed             This Visit's Progress    Monitor for and changes in thyroid or prostate   On track    Timeframe:  Long-Range Goal Priority:  Medium Start Date:      06/16/2021                       Expected End Date:   06/16/2022  Follow Up Date 11/10/2021   - call Dr. Henrene Pastor for any changes -take medications as prescribed. -recognize symptoms of an urinary track infection.   Why is this important?   The best way to learn about your health and care is by talking to the doctor and nurse.  They will answer your questions and give you information in the way that you like best.    Notes:      Observation of new symtoms related to breathing and history oif lung cancer,   On track    Timeframe:  Long-Range Goal Priority:  Medium Start Date:            06/16/2021                 Expected End Date:       06/16/2022                Follow Up Date 11/10/2021   - eat healthy - get at least 8 hours of sleep at night - get outdoors every day (weather permitting) - maintain a healthy weight    Why is this important?   Cancer treatment and its side effects can drain your energy. It can keep you from doing things you would like to do.  There are many things that can be done to manage fatigue.           The patient verbalized understanding of instructions, educational materials, and care plan provided today and agreed to receive a mailed copy of patient instructions, educational materials, and care plan.   Telephone follow up appointment with care management team member scheduled for:  11/10/2021  Tomasa Rand RN, BSN, CEN RN Case Manager - [facility] [employer] officer Mobile: [phone]

## 2021-09-09 ENCOUNTER — Other Ambulatory Visit: Payer: Self-pay

## 2021-09-09 DIAGNOSIS — E038 Other specified hypothyroidism: Secondary | ICD-10-CM

## 2021-09-09 MED ORDER — LEVOTHYROXINE SODIUM 75 MCG PO TABS: 75.0000 ug | ORAL_TABLET | Freq: Every day | ORAL | 2 refills | Status: DC

## 2021-11-10 ENCOUNTER — Telehealth: Payer: Medicare Other

## 2021-11-10 ENCOUNTER — Telehealth: Payer: Self-pay

## 2021-11-10 NOTE — Telephone Encounter (Signed)
°  Care Management   Follow Up Note   11/10/2021 Name: Jesus Irwin MRN: [id number] DOB: 1933-07-11   Referred by: Lillard Anes, MD Reason for referral : Chronic Care Management   An unsuccessful telephone outreach was attempted today. The patient was referred to the case management team for assistance with care management and care coordination.   Follow Up Plan: The care management team will reach out to the patient again over the next 7 days.   Tomasa Rand RN, BSN, CEN RN Case Freight forwarder - [facility] [employer] officer Mobile: [phone]

## 2021-11-16 ENCOUNTER — Ambulatory Visit (INDEPENDENT_AMBULATORY_CARE_PROVIDER_SITE_OTHER): Payer: Medicare Other | Admitting: Legal Medicine

## 2021-11-16 ENCOUNTER — Encounter: Payer: Self-pay | Admitting: Legal Medicine

## 2021-11-16 VITALS — BP 124/80 | HR 93 | Temp 98.1°F | Ht 69.0 in | Wt 138.0 lb

## 2021-11-16 DIAGNOSIS — J1282 Pneumonia due to coronavirus disease 2019: Secondary | ICD-10-CM

## 2021-11-16 DIAGNOSIS — J189 Pneumonia, unspecified organism: Secondary | ICD-10-CM

## 2021-11-16 DIAGNOSIS — U071 COVID-19: Secondary | ICD-10-CM

## 2021-11-16 NOTE — Progress Notes (Signed)
Established Patient Office Visit  Subjective:  Patient ID: Jesus Irwin, male    DOB: Aug 17, 1933  Age: 88 y.o. MRN: [id number]  CC:  Chief Complaint  Patient presents with   Transitions Of Care    Discharged 11/11/2021   Pneumonia    HPI Jesus Irwin presents for TRANSITION OF CARE .  Hospitalized on 10/26/2021 for COVID and community acquired pneumonia.  He was hypoxic and and in respiratory distress.  He was treated with antibiotics and discharged on 11/05/21 and [facility] [place] [facility] and discharged on 1.25.23.  He is now in home PT  by [facility].  History of lung cancer, no recurrence  Can walk with cane..  Past Medical History:  Diagnosis Date   BPH (benign prostatic hyperplasia)    Hypothyroidism    Mixed hyperlipidemia     History reviewed. No pertinent surgical history.  Family History  Family history unknown: Yes    Social History   Socioeconomic History   Marital status: Married    Spouse name: Jesus Irwin   Number of children: 0   Years of education: Not on file   Highest education level: Not on file  Occupational History   Not on file  Tobacco Use   Smoking status: Former    Types: Cigarettes    Quit date: 2006    Years since quitting: 17.0   Smokeless tobacco: Never  Vaping Use   Vaping Use: Never used  Substance and Sexual Activity   Alcohol use: Not Currently   Drug use: Never   Sexual activity: Not on file  Other Topics Concern   Not on file  Social History Narrative   Not on file   Social Determinants of Health   Financial Resource Strain: Not on file  Food Insecurity: No Food Insecurity   Worried About Charity fundraiser in the Last Year: Never true   [place] in the Last Year: Never true  Transportation Needs: No Transportation Needs   Lack of Transportation (Medical): No   Lack of Transportation (Non-Medical): No  Physical Activity: Inactive   Days of Exercise per Week: 0 days   Minutes of  Exercise per Session: 0 min  Stress: Not on file  Social Connections: Not on file  Intimate Partner Violence: Unknown   Fear of Current or Ex-Partner: No   Emotionally Abused: No   Physically Abused: Not on file   Sexually Abused: No    Outpatient Medications Prior to Visit  Medication Sig Dispense Refill   Ascorbic Acid (VITAMIN C) 1000 MG tablet Take 1,000 mg by mouth daily.     b complex vitamins capsule Take 1 capsule by mouth daily.     Bilberry 100 MG CAPS Take 1 tablet by mouth daily.     Cholecalciferol (VITAMIN D) 50 MCG (2000 UT) CAPS Take 1 tablet by mouth daily.     IRON, FERROUS SULFATE, PO Take 1 tablet by mouth daily. Reports taking veggie iron     levothyroxine (EUTHYROX) 75 MCG tablet Take 1 tablet (75 mcg total) by mouth daily. 90 tablet 2   Lutein 20 MG CAPS Take 1 tablet by mouth daily.     MULTIPLE VITAMIN PO Take 1 tablet by mouth daily.     Omega-3 Fatty Acids (FISH OIL PO) Take 1,500 capsules by mouth 2 (two) times daily.     Vitamin E 450 MG (1000 UT) CAPS Take 1 tablet by mouth daily.  No facility-administered medications prior to visit.    Allergies  Allergen Reactions   Penicillins     ROS Review of Systems  Constitutional: Negative.  Negative for activity change and appetite change.  HENT:  Negative for congestion.   Eyes:  Negative for visual disturbance.  Respiratory:  Negative for chest tightness and shortness of breath.   Cardiovascular:  Negative for chest pain and palpitations.  Gastrointestinal:  Negative for abdominal distention and abdominal pain.  Endocrine: Positive for polyuria.  Genitourinary: Negative.  Negative for difficulty urinating and dysuria.  Musculoskeletal:  Negative for arthralgias and back pain.  Neurological: Negative.   Psychiatric/Behavioral: Negative.       Objective:    Physical Exam Vitals reviewed.  Constitutional:      General: He is not in acute distress.    Appearance: Normal appearance.  HENT:      Head: Normocephalic.     Right Ear: Tympanic membrane, ear canal and external ear normal.     Left Ear: Tympanic membrane, ear canal and external ear normal.     Mouth/Throat:     Mouth: Mucous membranes are moist.     Pharynx: Oropharynx is clear.  Eyes:     Extraocular Movements: Extraocular movements intact.     Conjunctiva/sclera: Conjunctivae normal.     Pupils: Pupils are equal, round, and reactive to light.  Cardiovascular:     Rate and Rhythm: Normal rate and regular rhythm.     Pulses: Normal pulses.     Heart sounds: Normal heart sounds. No murmur heard.   No gallop.  Pulmonary:     Effort: Pulmonary effort is normal. No respiratory distress.     Breath sounds: Normal breath sounds. No wheezing.  Abdominal:     General: Abdomen is flat. Bowel sounds are normal. There is no distension.     Palpations: Abdomen is soft.     Tenderness: There is no abdominal tenderness.  Musculoskeletal:        General: Normal range of motion.     Cervical back: Normal range of motion and neck supple.     Right lower leg: No edema.     Left lower leg: No edema.  Skin:    General: Skin is warm.     Capillary Refill: Capillary refill takes less than 2 seconds.  Neurological:     General: No focal deficit present.     Mental Status: He is alert and oriented to person, place, and time. Mental status is at baseline.     Motor: Weakness present.  Psychiatric:        Mood and Affect: Mood normal.   BP 124/80    Pulse 93    Temp 98.1 F (36.7 C)    Ht 5' 9"  (1.753 m)    Wt 138 lb (62.6 kg)    SpO2 95%    BMI 20.38 kg/m  Wt Readings from Last 3 Encounters:  11/16/21 138 lb (62.6 kg)  08/11/21 145 lb (65.8 kg)  06/16/21 145 lb (65.8 kg)     Health Maintenance Due  Topic Date Due   COVID-19 Vaccine (1) Never done   TETANUS/TDAP  Never done   Zoster Vaccines- Shingrix (1 of 2) Never done   Pneumonia Vaccine 4+ Years old (1 - PCV) Never done    There are no preventive care reminders  to display for this patient.  Lab Results  Component Value Date   TSH 3.140 06/04/2021   Lab Results  Component Value Date   WBC 6.8 05/07/2020   HGB 16.6 05/07/2020   HCT 47.2 05/07/2020   MCV 98 (H) 05/07/2020   PLT 218 05/07/2020   Lab Results  Component Value Date   NA 141 06/04/2021   K 4.7 06/04/2021   CO2 27 06/04/2021   GLUCOSE 110 (H) 06/04/2021   BUN 19 06/04/2021   CREATININE 1.33 (H) 06/04/2021   BILITOT 0.7 06/04/2021   ALKPHOS 78 06/04/2021   AST 21 06/04/2021   ALT 13 06/04/2021   PROT 6.8 06/04/2021   ALBUMIN 4.4 06/04/2021   CALCIUM 9.4 06/04/2021   EGFR 52 (L) 06/04/2021   Lab Results  Component Value Date   CHOL 228 (H) 06/04/2021   Lab Results  Component Value Date   HDL 65 06/04/2021   Lab Results  Component Value Date   LDLCALC 142 (H) 06/04/2021   Lab Results  Component Value Date   TRIG 122 06/04/2021   Lab Results  Component Value Date   CHOLHDL 3.5 06/04/2021   No results found for: HGBA1C    Assessment & Plan:   Diagnoses and all orders for this visit: Pneumonia due to COVID-19 virus -     CBC with Differential/Platelet -     Comprehensive metabolic panel Patient is doing well after hospitalization and has gained strength      Follow-up: Return in about 3 months (around 02/14/2022) for chronic visit.    Reinaldo Meeker, MD

## 2021-11-17 LAB — CBC WITH DIFFERENTIAL/PLATELET
Basophils Absolute: 0 10*3/uL (ref 0.0–0.2)
Basos: 0 %
EOS (ABSOLUTE): 0.2 10*3/uL (ref 0.0–0.4)
Eos: 2 %
Hematocrit: 39.5 % (ref 37.5–51.0)
Hemoglobin: 13.1 g/dL (ref 13.0–17.7)
Immature Grans (Abs): 0.1 10*3/uL (ref 0.0–0.1)
Immature Granulocytes: 1 %
Lymphocytes Absolute: 1.3 10*3/uL (ref 0.7–3.1)
Lymphs: 13 %
MCH: 32.6 pg (ref 26.6–33.0)
MCHC: 33.2 g/dL (ref 31.5–35.7)
MCV: 98 fL — ABNORMAL HIGH (ref 79–97)
Monocytes Absolute: 0.7 10*3/uL (ref 0.1–0.9)
Monocytes: 6 %
Neutrophils Absolute: 7.9 10*3/uL — ABNORMAL HIGH (ref 1.4–7.0)
Neutrophils: 78 %
Platelets: 170 10*3/uL (ref 150–450)
RBC: 4.02 x10E6/uL — ABNORMAL LOW (ref 4.14–5.80)
RDW: 12.6 % (ref 11.6–15.4)
WBC: 10.1 10*3/uL (ref 3.4–10.8)

## 2021-11-17 LAB — COMPREHENSIVE METABOLIC PANEL
ALT: 23 IU/L (ref 0–44)
AST: 20 IU/L (ref 0–40)
Albumin/Globulin Ratio: 1.4 (ref 1.2–2.2)
Albumin: 3.7 g/dL (ref 3.6–4.6)
Alkaline Phosphatase: 67 IU/L (ref 44–121)
BUN/Creatinine Ratio: 20 (ref 10–24)
BUN: 21 mg/dL (ref 8–27)
Bilirubin Total: 0.7 mg/dL (ref 0.0–1.2)
CO2: 28 mmol/L (ref 20–29)
Calcium: 9 mg/dL (ref 8.6–10.2)
Chloride: 95 mmol/L — ABNORMAL LOW (ref 96–106)
Creatinine, Ser: 1.07 mg/dL (ref 0.76–1.27)
Globulin, Total: 2.6 g/dL (ref 1.5–4.5)
Glucose: 91 mg/dL (ref 70–99)
Potassium: 5.1 mmol/L (ref 3.5–5.2)
Sodium: 136 mmol/L (ref 134–144)
Total Protein: 6.3 g/dL (ref 6.0–8.5)
eGFR: 68 mL/min/{1.73_m2} (ref >59)

## 2021-11-17 NOTE — Progress Notes (Signed)
CBC ok, add B12, kidney and liver tests normal,  lp

## 2021-11-19 LAB — VITAMIN B12: Vitamin B-12: 594 pg/mL (ref 232–1245)

## 2021-11-19 LAB — SPECIMEN STATUS REPORT

## 2021-12-14 ENCOUNTER — Telehealth: Payer: Self-pay

## 2021-12-14 DIAGNOSIS — I361 Nonrheumatic tricuspid (valve) insufficiency: Secondary | ICD-10-CM | POA: Diagnosis not present

## 2021-12-14 DIAGNOSIS — I272 Pulmonary hypertension, unspecified: Secondary | ICD-10-CM | POA: Diagnosis not present

## 2021-12-14 NOTE — Telephone Encounter (Signed)
General/Other - ShOB/ low oxygen sats  Wife calling wanting appointment for patient as his oxygen has been 70-80s. Does have bilateral ankle swelling and hx of lung cancer. Oxygen sat while on phone was 89%. Typically at 91%. Patient has been more ShOB when walking.   Advised wife that patient should go to hospital immediately. Wife refused and asked for an appointment x2. No appointments in office this afternoon. Made wife aware this is serious and patient needing to be evaluated. They VU of need to go to hospital, would not confirm/deny if they would be proceeding to ED.   Royce Macadamia, [place] 12/14/21 2:01 PM

## 2021-12-18 ENCOUNTER — Other Ambulatory Visit: Payer: Self-pay | Admitting: Oncology

## 2021-12-18 ENCOUNTER — Inpatient Hospital Stay: Payer: Medicare Other | Attending: Oncology | Admitting: Oncology

## 2021-12-18 ENCOUNTER — Other Ambulatory Visit: Payer: Self-pay

## 2021-12-18 VITALS — BP 122/61 | HR 101 | Temp 97.4°F | Resp 18 | Ht 69.0 in | Wt 140.7 lb

## 2021-12-18 DIAGNOSIS — Z85118 Personal history of other malignant neoplasm of bronchus and lung: Secondary | ICD-10-CM

## 2021-12-19 NOTE — Progress Notes (Signed)
[facility]  [address] [place],  [place]  [address] [phone]  Clinic Day:  12/19/2021  Referring physician: Lillard Anes,*   HISTORY OF PRESENT ILLNESS:  The patient is a 88 y.o. 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  As scans for 10+ years showed no evidence of disease recurrence, the patient was considered cured of his disease.  However, he comes back in today as a CT scan done during his recent hospitalization showed findings in his right lung that were potentially concerning for disease recurrence.  Of note, the patient was admitted for presumed pneumonia.  He has always had chronic volume loss in his right lung ever since he underwent chemoradiation.  He denies having any hemoptysis, weight loss, or other ominous symptoms that concern him for recurrent lung cancer.  PHYSICAL EXAM:  Blood pressure 122/61, pulse (!) 101, temperature (!) 97.4 F (36.3 C), resp. rate 18, height 5\' 9"  (1.753 m), weight 140 lb 11.2 oz (63.8 kg), SpO2 92 %. Wt Readings from Last 3 Encounters:  12/18/21 140 lb 11.2 oz (63.8 kg)  11/16/21 138 lb (62.6 kg)  08/11/21 145 lb (65.8 kg)   Body mass index is 20.78 kg/m. Performance status (ECOG): 1 - Symptomatic but completely ambulatory Physical Exam Constitutional:      Appearance: Normal appearance. He is not ill-appearing.  HENT:     Mouth/Throat:     Mouth: Mucous membranes are moist.     Pharynx: Oropharynx is clear. No oropharyngeal exudate or posterior oropharyngeal erythema.  Cardiovascular:     Rate and Rhythm: Normal rate and regular rhythm.     Heart sounds: No murmur heard.   No friction rub. No gallop.  Pulmonary:     Effort: Pulmonary effort is normal. No respiratory distress.     Breath sounds: Normal breath sounds. No wheezing, rhonchi or rales.  Abdominal:     General: Bowel sounds are normal. There is  no distension.     Palpations: Abdomen is soft. There is no mass.     Tenderness: There is no abdominal tenderness.  Musculoskeletal:        General: No swelling.     Right lower leg: No edema.     Left lower leg: No edema.  Lymphadenopathy:     Cervical: No cervical adenopathy.     Upper Body:     Right upper body: No supraclavicular or axillary adenopathy.     Left upper body: No supraclavicular or axillary adenopathy.     Lower Body: No right inguinal adenopathy. No left inguinal adenopathy.  Skin:    General: Skin is warm.     Coloration: Skin is not jaundiced.     Findings: No lesion or rash.  Neurological:     General: No focal deficit present.     Mental Status: He is alert and oriented to person, place, and time. Mental status is at baseline.  Psychiatric:        Mood and Affect: Mood normal.        Behavior: Behavior normal.        Thought Content: Thought content normal.    LABS:   CBC Latest Ref Rng & Units 11/16/2021 05/07/2020 04/04/2007  WBC 3.4 - 10.8 x10E3/uL 10.1 6.8 13.7(H)  Hemoglobin 13.0 - 17.7 g/dL 13.1 16.6 10.1(L)  Hematocrit 37.5 - 51.0 % 39.5 47.2 28.8(L)  Platelets 150 -  450 x10E3/uL 170 218 135(L)   CMP Latest Ref Rng & Units 11/16/2021 06/04/2021 05/07/2020  Glucose 70 - 99 mg/dL 91 110(H) 100(H)  BUN 8 - 27 mg/dL 21 19 16   Creatinine 0.76 - 1.27 mg/dL 1.07 1.33(H) 1.17  Sodium 134 - 144 mmol/L 136 141 137  Potassium 3.5 - 5.2 mmol/L 5.1 4.7 4.6  Chloride 96 - 106 mmol/L 95(L) 96 98  CO2 20 - 29 mmol/L 28 27 27   Calcium 8.6 - 10.2 mg/dL 9.0 9.4 9.5  Total Protein 6.0 - 8.5 g/dL 6.3 6.8 7.1  Total Bilirubin 0.0 - 1.2 mg/dL 0.7 0.7 0.8  Alkaline Phos 44 - 121 IU/L 67 78 88  AST 0 - 40 IU/L 20 21 21   ALT 0 - 44 IU/L 23 13 11    STUDIES:  His recent chest CT revealed the following:  FINDINGS:  Cardiovascular: Satisfactory opacification of the pulmonary arteries  to the segmental level. No evidence of pulmonary embolism. Markedly  limited  evaluation of the subsegmental level due to respiratory  motion artifact. The main pulmonary artery is enlarged measuring up  to 3.3 cm. Normal heart size. No significant pericardial effusion.  The thoracic aorta is normal in caliber. Severe atherosclerotic  plaque of the thoracic aorta. Three-vessel coronary artery  calcifications.  Mediastinum/Nodes: No enlarged mediastinal, hilar, or axillary lymph  nodes. Thyroid gland, trachea, and esophagus demonstrate no  significant findings.  Lungs/Pleura: Limited evaluation due to respiratory motion artifact.  Mild-to-moderate centrilobular emphysematous changes.  Redemonstration of right lung paramediastinal radiation fibrosis  including traction bronchiectasis, architectural distortion, volume  loss. Interval development of associated increased  peribronchovascular consolidation. Right upper lobe calcified  pulmonary micronodule. There is a spiculated appearing 1.1 cm  nodular-density in the right lung (4:75). Adjacent micronodule  satellite lesions are noted. No pulmonary mass. Trace right pleural  effusion with query circumferential pleural thickening. Interval  development of right pleural calcifications. No pneumothorax.  Upper Abdomen: Severe atherosclerotic plaque. Redemonstration of a  left hepatic lobe fluid density lesion likely representing a simple  hepatic cyst measuring up to 5 cm. Several other fluid density  lesions likely represent smaller cysts. Subcentimeter hypodensities  are too small to characterize. Fluid density lesions are noted  within bilateral kidneys likely representing simple renal cysts.  There is an exophytic left renal 6.6 cm hypodense lesion with a  density of 27 Hounsfield units.  Musculoskeletal:  No abdominal wall hernia or abnormality.  No suspicious lytic or blastic osseous lesions. No acute displaced  fracture. Multilevel degenerative changes of the spine.  Review of the MIP images confirms the above  findings.   IMPRESSION:  1. No pulmonary embolus. Markedly limited evaluation of the  subsegmental level due to respiratory motion artifact.  2. Enlarged main pulmonary artery consistent with pulmonary  hypertension.  3. Right lung paramediastinal radiation fibrosis with associated  increased peribronchovascular consolidation throughout the right  lung. Findings suggestive of superimposed infection. Recommend  follow-up CT in 3 months to evaluate for stability and exclude an  underlying malignancy.  4. A spiculated appearing 1.1 cm nodular-density in the right lung  with Adjacent micronodule satellite lesions.  5. Trace right pleural effusion with query pleural thickening and  interval development of scattered pleural calcifications. Finding  may represent scarring. Superimposed infection or underlying  malignancy cannot be fully excluded. Recommend follow-up CT in 3  months to evaluate for stability.  6. Indeterminate 6.6 cm left renal cystic lesion. Finding could  represent a complex cyst  versus solid renal mass. Recommend  nonemergent MRI renal protocol further evaluation. When the patient  is clinically stable and able to follow directions and hold their  breath (preferably as an outpatient) further evaluation with  dedicated abdominal MRI should be considered.  7. Aortic Atherosclerosis (ICD10-I70.0) - severe, including at least  3 vessel coronary calcification.   ASSESSMENT & PLAN:  Assessment/Plan:  An 88 y.o. male with a history of stage IIIa squamous cell lung cancer, who completed definitive chemoradiation in 2008.  In clinic today, I went over all of his CT scan images with him.  Overall, I do not get the impression this gentleman has late recurrence of lung cancer.  Most of the findings seen in his right lung appear to be inflammatory and/or infectious in nature.  It does appear this gentleman also has benign cystic lesions throughout his kidneys and liver, which are not new.   Although I do not feel any form of cancer is back, I will repeat his chest CT in 3 months to see if some of the consolidation in question has resolved.  If so, he will likely be discharged from clinic after his next visit.  The patient understands all the plans discussed today and is in agreement with them.  Donelda Mailhot Macarthur Critchley, MD

## 2021-12-22 ENCOUNTER — Telehealth: Payer: Medicare Other

## 2021-12-23 ENCOUNTER — Other Ambulatory Visit: Payer: Self-pay

## 2021-12-23 ENCOUNTER — Encounter: Payer: Self-pay | Admitting: Legal Medicine

## 2021-12-23 ENCOUNTER — Ambulatory Visit (INDEPENDENT_AMBULATORY_CARE_PROVIDER_SITE_OTHER): Payer: Medicare Other | Admitting: Legal Medicine

## 2021-12-23 DIAGNOSIS — J9610 Chronic respiratory failure, unspecified whether with hypoxia or hypercapnia: Secondary | ICD-10-CM | POA: Insufficient documentation

## 2021-12-23 DIAGNOSIS — J9611 Chronic respiratory failure with hypoxia: Secondary | ICD-10-CM | POA: Diagnosis not present

## 2021-12-23 DIAGNOSIS — R0902 Hypoxemia: Secondary | ICD-10-CM

## 2021-12-23 NOTE — Progress Notes (Signed)
? ?Acute Office Visit ? ?Subjective:  ? ? Patient ID: Jesus Irwin, male    DOB: Nov 19, 1932, 88 y.o.   MRN: [id number] ? ?Chief Complaint  ?Patient presents with  ? Hospitalization Follow-up  ? ? ?[id number] of care and reconciliation of medicines: ?Patient is in today for hospital follow up. Patient was in [facility] 2/27 to 3/1. Admitted for exertional dyspnea, lung nodules, acute respiratory failure with hypoxia. He was felt to have hypoxia frm pulmonary scarring from radiation therapy for lung cancer. On CT he was found a spiculated area and evaluated by Dr. Bobby Rumpf who felt that this did not signify recurrent cancer. He ws sent home 1LO2/min but ony using PRN.  He has O2 sat of 90% without O2.  He has dyspnea walking to mailbox.  ?Past Medical History:  ?Diagnosis Date  ? BPH (benign prostatic hyperplasia)   ? Hypothyroidism   ? Mixed hyperlipidemia   ? ? ?History reviewed. No pertinent surgical history. ? ?Family History  ?Family history unknown: Yes  ? ? ?Social History  ? ?Socioeconomic History  ? Marital status: Married  ?  Spouse name: Meryl Crutch  ? Number of children: 0  ? Years of education: Not on file  ? Highest education level: Not on file  ?Occupational History  ? Not on file  ?Tobacco Use  ? Smoking status: Former  ?  Types: Cigarettes  ?  Quit date: 2006  ?  Years since quitting: 17.1  ? Smokeless tobacco: Never  ?Vaping Use  ? Vaping Use: Never used  ?Substance and Sexual Activity  ? Alcohol use: Not Currently  ? Drug use: Never  ? Sexual activity: Not on file  ?Other Topics Concern  ? Not on file  ?Social History Narrative  ? Not on file  ? ?Social Determinants of Health  ? ?Financial Resource Strain: Not on file  ?Food Insecurity: No Food Insecurity  ? Worried About Charity fundraiser in the Last Year: Never true  ? Ran Out of Food in the Last Year: Never true  ?Transportation Needs: No Transportation Needs  ? Lack of Transportation (Medical): No  ? Lack of Transportation (Non-Medical): No   ?Physical Activity: Inactive  ? Days of Exercise per Week: 0 days  ? Minutes of Exercise per Session: 0 min  ?Stress: Not on file  ?Social Connections: Not on file  ?Intimate Partner Violence: Unknown  ? Fear of Current or Ex-Partner: No  ? Emotionally Abused: No  ? Physically Abused: Not on file  ? Sexually Abused: No  ? ? ?Outpatient Medications Prior to Visit  ?Medication Sig Dispense Refill  ? Ascorbic Acid (VITAMIN C) 1000 MG tablet Take 1,000 mg by mouth daily.    ? b complex vitamins capsule Take 1 capsule by mouth daily.    ? Bilberry 100 MG CAPS Take 1 tablet by mouth daily.    ? Cholecalciferol (VITAMIN D) 50 MCG (2000 UT) CAPS Take 1 tablet by mouth daily.    ? IRON, FERROUS SULFATE, PO Take 1 tablet by mouth daily. Reports taking veggie iron    ? levothyroxine (EUTHYROX) 75 MCG tablet Take 1 tablet (75 mcg total) by mouth daily. 90 tablet 2  ? Lutein 20 MG CAPS Take 1 tablet by mouth daily.    ? MULTIPLE VITAMIN PO Take 1 tablet by mouth daily.    ? Omega-3 Fatty Acids (FISH OIL PO) Take 1,500 capsules by mouth 2 (two) times daily.    ? Vitamin E 450  MG (1000 UT) CAPS Take 1 tablet by mouth daily.    ? ?No facility-administered medications prior to visit.  ? ? ?Allergies  ?Allergen Reactions  ? Penicillins   ? ? ?Review of Systems  ?Constitutional:  Negative for activity change and appetite change.  ?HENT:  Negative for congestion.   ?Eyes:  Negative for visual disturbance.  ?Respiratory:  Negative for chest tightness and shortness of breath.   ?Cardiovascular:  Negative for chest pain.  ?Gastrointestinal: Negative.  Negative for abdominal distention and abdominal pain.  ?Endocrine: Negative for polyuria.  ?Genitourinary:  Negative for difficulty urinating.  ?Musculoskeletal: Negative.  Negative for arthralgias.  ?Neurological: Negative.   ?Psychiatric/Behavioral: Negative.    ? ?   ?Objective:  ?  ?Physical Exam ?Vitals reviewed.  ?Constitutional:   ?   General: He is not in acute distress. ?    Appearance: Normal appearance.  ?HENT:  ?   Head: Normocephalic.  ?   Right Ear: Tympanic membrane normal.  ?   Left Ear: Tympanic membrane normal.  ?   Nose: Nose normal.  ?   Mouth/Throat:  ?   Mouth: Mucous membranes are moist.  ?   Pharynx: Oropharynx is clear.  ?Eyes:  ?   Extraocular Movements: Extraocular movements intact.  ?   Conjunctiva/sclera: Conjunctivae normal.  ?   Pupils: Pupils are equal, round, and reactive to light.  ?Cardiovascular:  ?   Rate and Rhythm: Normal rate and regular rhythm.  ?   Pulses: Normal pulses.  ?   Heart sounds: Normal heart sounds. No murmur heard. ?  No gallop.  ?Pulmonary:  ?   Effort: Pulmonary effort is normal. No respiratory distress.  ?   Breath sounds: Normal breath sounds. No wheezing.  ?Abdominal:  ?   General: Abdomen is flat. Bowel sounds are normal. There is no distension.  ?   Tenderness: There is no abdominal tenderness.  ?Musculoskeletal:     ?   General: Normal range of motion.  ?   Cervical back: Normal range of motion and neck supple.  ?   Right lower leg: No edema.  ?   Left lower leg: No edema.  ?Skin: ?   General: Skin is warm and dry.  ?   Capillary Refill: Capillary refill takes less than 2 seconds.  ?Neurological:  ?   General: No focal deficit present.  ?   Mental Status: He is alert and oriented to person, place, and time. Mental status is at baseline.  ? ? ?BP (!) 106/56   Pulse (!) 101   Temp 97.7 ?F (36.5 ?C)   Resp 20   Ht 5' 9"  (1.753 m)   Wt 145 lb (65.8 kg)   SpO2 90%   BMI 21.41 kg/m?  ?Wt Readings from Last 3 Encounters:  ?12/23/21 145 lb (65.8 kg)  ?12/18/21 140 lb 11.2 oz (63.8 kg)  ?11/16/21 138 lb (62.6 kg)  ? ? ?Health Maintenance Due  ?Topic Date Due  ? COVID-19 Vaccine (1) Never done  ? TETANUS/TDAP  Never done  ? Zoster Vaccines- Shingrix (1 of 2) Never done  ? Pneumonia Vaccine 54+ Years old (1 - PCV) Never done  ? ? ?There are no preventive care reminders to display for this patient. ? ? ?Lab Results  ?Component Value Date   ? TSH 3.140 06/04/2021  ? ?Lab Results  ?Component Value Date  ? WBC 10.1 11/16/2021  ? HGB 13.1 11/16/2021  ? HCT 39.5 11/16/2021  ?  MCV 98 (H) 11/16/2021  ? PLT 170 11/16/2021  ? ?Lab Results  ?Component Value Date  ? NA 136 11/16/2021  ? K 5.1 11/16/2021  ? CO2 28 11/16/2021  ? GLUCOSE 91 11/16/2021  ? BUN 21 11/16/2021  ? CREATININE 1.07 11/16/2021  ? BILITOT 0.7 11/16/2021  ? ALKPHOS 67 11/16/2021  ? AST 20 11/16/2021  ? ALT 23 11/16/2021  ? PROT 6.3 11/16/2021  ? ALBUMIN 3.7 11/16/2021  ? CALCIUM 9.0 11/16/2021  ? EGFR 68 11/16/2021  ? ?Lab Results  ?Component Value Date  ? CHOL 228 (H) 06/04/2021  ? ?Lab Results  ?Component Value Date  ? HDL 65 06/04/2021  ? ?Lab Results  ?Component Value Date  ? LDLCALC 142 (H) 06/04/2021  ? ?Lab Results  ?Component Value Date  ? TRIG 122 06/04/2021  ? ?Lab Results  ?Component Value Date  ? CHOLHDL 3.5 06/04/2021  ? ?No results found for: HGBA1C ? ?   ?Assessment & Plan:  ? ?Problem List Items Addressed This Visit   ? ?  ? Respiratory  ? Chronic respiratory failure ([place]) ?Walking test originally 95% on RA and desaturated to 85% without O2, then put him on 1l/min up to 95%. ?He has O2 concentrator at home and portable O2, Keep on O2 1 L.min at all times. ?Patient was admitted for chronic respiratory failure with hypoxemia he was worked up for community-acquired pneumonia but then felt to have more fibrosis from radiation.  A new spiculated area was noted and therefore he saw Dr. Bobby Rumpf who felt it was not a recurrence of his cancer.  This new area was not able to be reached for biopsy either.  He will be followed by Dr. Bobby Rumpf with serial scans and biopsy if needed.  He has very limited exercise tolerance and needs to be wearing oxygen 1 L/min chronically.  He has the equipment at home.  ?  ? Other  ? Hypoxemia ?The region was admitted for hypoxemia related to lung scarring from radiation therapy from his lung cancer in the past.  No infection was found at hospitalization.   ? ? ? ?  ? ?Follow-up: Return in 3 months (on 03/25/2022) for chronic respiratory failure. ? ?An After Visit Summary was printed and given to the patient. ? ?Reinaldo Meeker, MD ?[place] ?[phone] ?

## 2022-01-20 ENCOUNTER — Encounter: Payer: Self-pay | Admitting: Legal Medicine

## 2022-03-22 ENCOUNTER — Encounter: Payer: Self-pay | Admitting: Oncology

## 2022-03-22 NOTE — Progress Notes (Signed)
[facility]  [address] [place],  [place]  [address] [phone]  Clinic Day:  03/23/2022  Referring physician: Lillard Anes,*   HISTORY OF PRESENT ILLNESS:  The patient is a 88 y.o. 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  As scans for 10+ years showed no evidence of disease recurrence, the patient was considered cured of his disease.  Earlier this year, a CT scan done during a recent hospitalization showed findings in his right lung that were potentially concerning for disease recurrence.  However, per my inspection of his scan, I did not appreciate any new, ominous findings which suggested this.  He comes back in today to review his chest CT done yesterday to ensure no new findings have developed over the past few months.  Since his last visit, the patient has been doing well.  He denies having any hemoptysis, weight loss, or other ominous symptoms that concern him for recurrent lung cancer.  PHYSICAL EXAM:  Blood pressure (!) 151/84, pulse 99, temperature 97.8 F (36.6 C), resp. rate 18, height 5\' 9"  (1.753 m), weight 141 lb 9.6 oz (64.2 kg), SpO2 90 %. Wt Readings from Last 3 Encounters:  03/23/22 141 lb 9.6 oz (64.2 kg)  12/23/21 145 lb (65.8 kg)  12/18/21 140 lb 11.2 oz (63.8 kg)   Body mass index is 20.91 kg/m. Performance status (ECOG): 1 - Symptomatic but completely ambulatory Physical Exam Constitutional:      Appearance: Normal appearance. He is not ill-appearing.  HENT:     Mouth/Throat:     Mouth: Mucous membranes are moist.     Pharynx: Oropharynx is clear. No oropharyngeal exudate or posterior oropharyngeal erythema.  Cardiovascular:     Rate and Rhythm: Normal rate and regular rhythm.     Heart sounds: No murmur heard.    No friction rub. No gallop.  Pulmonary:     Effort: Pulmonary effort is normal. No respiratory distress.     Breath  sounds: Normal breath sounds. No wheezing, rhonchi or rales.  Abdominal:     General: Bowel sounds are normal. There is no distension.     Palpations: Abdomen is soft. There is no mass.     Tenderness: There is no abdominal tenderness.  Musculoskeletal:        General: No swelling.     Right lower leg: No edema.     Left lower leg: No edema.  Lymphadenopathy:     Cervical: No cervical adenopathy.     Upper Body:     Right upper body: No supraclavicular or axillary adenopathy.     Left upper body: No supraclavicular or axillary adenopathy.     Lower Body: No right inguinal adenopathy. No left inguinal adenopathy.  Skin:    General: Skin is warm.     Coloration: Skin is not jaundiced.     Findings: No lesion or rash.  Neurological:     General: No focal deficit present.     Mental Status: He is alert and oriented to person, place, and time. Mental status is at baseline.  Psychiatric:        Mood and Affect: Mood normal.        Behavior: Behavior normal.        Thought Content: Thought content normal.   SCANS: His chest CT done yesterday revealed the following: FINDINGS: Cardiovascular: The heart size is normal. No substantial  pericardial effusion. Coronary artery calcification is evident. Moderate atherosclerotic calcification is noted in the wall of the thoracic aorta. Ascending thoracic aorta measures 4 cm diameter. Enlargement of the pulmonary outflow tract/main pulmonary arteries suggests pulmonary arterial hypertension.  Mediastinum/Nodes: 11 mm short axis AP window lymph node is stable in the interval. 10 mm short axis subcarinal node on 62/2 measures slightly smaller than before. There is no hilar lymphadenopathy. There is no axillary lymphadenopathy. The esophagus has normal imaging features.  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 Architectural distortion and  scarring noted right lung base with interval decrease in the consolidative airspace disease noted in the inferior right lung previously. 11 mm posterior right upper lung nodule measured previously is 9 mm today on image 43/301. Chronic right pleural thickening inferiorly is stable. No new suspicious pulmonary nodule or mass in either lung.  Upper Abdomen: Similar appearance of hepatic and renal cysts. Cystic lesion cranial to the pancreatic head and medial to the proximal stomach is stable.  Musculoskeletal: No worrisome lytic or sclerotic osseous abnormality.  IMPRESSION: 1. Stable exam. No new or progressive findings to suggest recurrent or metastatic disease in the chest. 2. Spiculated 11 mm posterior right lung nodule identified previously is slightly smaller in the interval. 3. Enlargement of the pulmonary outflow tract/main pulmonary arteries suggests pulmonary arterial hypertension. 4. Stable appearance of post treatment changes in the right hilum and right parahilar radiation fibrosis. 5. Interval decrease in the consolidative airspace disease noted in the inferior right lung on the prior study. 6. Stable mild mediastinal lymphadenopathy. 7. Aortic Atherosclerosis (ICD10-I70.0).  ASSESSMENT & PLAN:  Assessment/Plan:  An 88 y.o. male with a history of stage IIIA squamous cell lung cancer, who completed definitive chemoradiation in 2008.  In clinic today, I went over all of his CT scan images with him, for which he could see that some areas in his right lung have clearly improved.  This gentleman has chronic right lung changes from the radiation he received 15 years ago.  Overall, there is nothing that I see which suggests this gentleman has new/recurrent lung cancer.  Clinically, this patient is doing well.  I do consider him once again cured of his lung cancer from 15 years ago.  I do feel comfortable turning his care back over to his primary care office.  I would not have a  problem seeing him in the future if new oncologic issues arise that require repeat clinical assessment.  The patient understands all the plans discussed today and is in agreement with them.  Zaia Carre Macarthur Critchley, MD

## 2022-03-23 ENCOUNTER — Inpatient Hospital Stay: Payer: Medicare Other | Attending: Oncology | Admitting: Oncology

## 2022-03-23 VITALS — BP 151/84 | HR 99 | Temp 97.8°F | Resp 18 | Ht 69.0 in | Wt 141.6 lb

## 2022-03-23 DIAGNOSIS — Z85118 Personal history of other malignant neoplasm of bronchus and lung: Secondary | ICD-10-CM | POA: Diagnosis not present

## 2022-03-30 ENCOUNTER — Ambulatory Visit (INDEPENDENT_AMBULATORY_CARE_PROVIDER_SITE_OTHER): Payer: Medicare Other | Admitting: Legal Medicine

## 2022-03-30 ENCOUNTER — Encounter: Payer: Self-pay | Admitting: Legal Medicine

## 2022-03-30 VITALS — BP 120/70 | HR 101 | Temp 98.2°F | Resp 16 | Ht 69.0 in | Wt 141.0 lb

## 2022-03-30 DIAGNOSIS — N400 Enlarged prostate without lower urinary tract symptoms: Secondary | ICD-10-CM

## 2022-03-30 DIAGNOSIS — Z85118 Personal history of other malignant neoplasm of bronchus and lung: Secondary | ICD-10-CM

## 2022-03-30 DIAGNOSIS — E038 Other specified hypothyroidism: Secondary | ICD-10-CM | POA: Diagnosis not present

## 2022-03-30 DIAGNOSIS — E782 Mixed hyperlipidemia: Secondary | ICD-10-CM

## 2022-03-30 DIAGNOSIS — J9611 Chronic respiratory failure with hypoxia: Secondary | ICD-10-CM | POA: Diagnosis not present

## 2022-03-30 DIAGNOSIS — E559 Vitamin D deficiency, unspecified: Secondary | ICD-10-CM

## 2022-03-30 DIAGNOSIS — H353231 Exudative age-related macular degeneration, bilateral, with active choroidal neovascularization: Secondary | ICD-10-CM

## 2022-03-30 NOTE — Progress Notes (Signed)
Subjective:  Patient ID: Jesus Irwin, male    DOB: Aug 01, 1933  Age: 88 y.o. MRN: [id number]  Chief Complaint  Patient presents with   Chronic Respiratory failure    HPI Patient was seen on 12/23/2021 for hypoxemia and chronic respiratory failure in the hospital. He is doing better. He saw Dr Bobby Rumpf on 03/23/2022 for History of lung cancer. There does not appear any recuance of cancer but patient is using O2 at night and PRN.  He has hypothyroidism and on levothyroxine 73mcg a day, not checked  in 9 months  He is on vitamin D and B12   Current Outpatient Medications on File Prior to Visit  Medication Sig Dispense Refill   Ascorbic Acid (VITAMIN C) 1000 MG tablet Take 1,000 mg by mouth daily.     b complex vitamins capsule Take 1 capsule by mouth daily.     Bilberry 100 MG CAPS Take 1 tablet by mouth daily.     Cholecalciferol (VITAMIN D) 50 MCG (2000 UT) CAPS Take 1 tablet by mouth daily.     IRON, FERROUS SULFATE, PO Take 1 tablet by mouth daily. Reports taking veggie iron     levothyroxine (EUTHYROX) 75 MCG tablet Take 1 tablet (75 mcg total) by mouth daily. 90 tablet 2   Lutein 20 MG CAPS Take 1 tablet by mouth daily.     MULTIPLE VITAMIN PO Take 1 tablet by mouth daily.     Omega-3 Fatty Acids (FISH OIL PO) Take 1,500 capsules by mouth 2 (two) times daily.     Vitamin E 450 MG (1000 UT) CAPS Take 1 tablet by mouth daily.     No current facility-administered medications on file prior to visit.   Past Medical History:  Diagnosis Date   BPH (benign prostatic hyperplasia)    Hypothyroidism    Mixed hyperlipidemia    History reviewed. No pertinent surgical history.  Family History  Family history unknown: Yes   Social History   Socioeconomic History   Marital status: Married    Spouse name: Ethel   Number of children: 0   Years of education: Not on file   Highest education level: Not on file  Occupational History   Not on file  Tobacco Use   Smoking status:  Former    Types: Cigarettes    Quit date: 2006    Years since quitting: 17.4   Smokeless tobacco: Never  Vaping Use   Vaping Use: Never used  Substance and Sexual Activity   Alcohol use: Not Currently   Drug use: Never   Sexual activity: Not on file  Other Topics Concern   Not on file  Social History Narrative   Not on file   Social Determinants of Health   Financial Resource Strain: Not on file  Food Insecurity: No Food Insecurity (06/16/2021)   Hunger Vital Sign    Worried About Running Out of Food in the Last Year: Never true    Ran Out of Food in the Last Year: Never true  Transportation Needs: No Transportation Needs (06/16/2021)   PRAPARE - Hydrologist (Medical): No    Lack of Transportation (Non-Medical): No  Physical Activity: Inactive (06/16/2021)   Exercise Vital Sign    Days of Exercise per Week: 0 days    Minutes of Exercise per Session: 0 min  Stress: Not on file  Social Connections: Not on file    Review of Systems  Constitutional:  Negative for chills,  fatigue, fever and unexpected weight change.  HENT:  Negative for congestion, ear pain, sinus pain and sore throat.   Eyes:  Negative for visual disturbance.  Respiratory:  Positive for shortness of breath. Negative for cough.   Cardiovascular:  Negative for chest pain and palpitations.  Gastrointestinal:  Negative for abdominal pain, blood in stool, constipation, diarrhea, nausea and vomiting.  Endocrine: Negative for polydipsia.  Genitourinary:  Negative for dysuria.  Musculoskeletal:  Negative for back pain.  Skin:  Negative for rash.  Neurological:  Negative for headaches.  Psychiatric/Behavioral: Negative.       Objective:  BP 120/70   Pulse (!) 101   Temp 98.2 F (36.8 C)   Resp 16   Ht 5\' 9"  (1.753 m)   Wt 141 lb (64 kg)   SpO2 93%   BMI 20.82 kg/m      03/30/2022    1:31 PM 03/23/2022    2:59 PM 12/23/2021    1:59 PM  BP/Weight  Systolic BP 841 324 401   Diastolic BP 70 84 56  Wt. (Lbs) 141 141.6 145  BMI 20.82 kg/m2 20.91 kg/m2 21.41 kg/m2    Physical Exam Vitals reviewed.  Constitutional:      General: He is in acute distress.     Appearance: Normal appearance.  HENT:     Head: Normocephalic.     Right Ear: Tympanic membrane normal.     Left Ear: Tympanic membrane normal.     Nose: Nose normal.     Mouth/Throat:     Mouth: Mucous membranes are moist.     Pharynx: Oropharynx is clear.  Eyes:     Extraocular Movements: Extraocular movements intact.     Conjunctiva/sclera: Conjunctivae normal.     Pupils: Pupils are equal, round, and reactive to light.  Cardiovascular:     Rate and Rhythm: Normal rate and regular rhythm.     Pulses: Normal pulses.     Heart sounds: Normal heart sounds. No murmur heard.    No gallop.  Pulmonary:     Effort: Pulmonary effort is normal. No respiratory distress.     Breath sounds: Normal breath sounds. No wheezing.  Abdominal:     General: Abdomen is flat. Bowel sounds are normal. There is no distension.     Tenderness: There is no abdominal tenderness.  Musculoskeletal:     Cervical back: Normal range of motion and neck supple.     Right lower leg: No edema.     Left lower leg: No edema.     Comments: Arthritis knees  Skin:    General: Skin is warm.     Capillary Refill: Capillary refill takes less than 2 seconds.  Neurological:     General: No focal deficit present.     Mental Status: He is alert and oriented to person, place, and time. Mental status is at baseline.     Motor: Weakness present.         Lab Results  Component Value Date   WBC 10.1 11/16/2021   HGB 13.1 11/16/2021   HCT 39.5 11/16/2021   PLT 170 11/16/2021   GLUCOSE 91 11/16/2021   CHOL 228 (H) 06/04/2021   TRIG 122 06/04/2021   HDL 65 06/04/2021   LDLCALC 142 (H) 06/04/2021   ALT 23 11/16/2021   AST 20 11/16/2021   NA 136 11/16/2021   K 5.1 11/16/2021   CL 95 (L) 11/16/2021   CREATININE 1.07 11/16/2021    BUN 21 11/16/2021  CO2 28 11/16/2021   TSH 3.140 06/04/2021      Assessment & Plan:   Problem List Items Addressed This Visit       Respiratory   Chronic respiratory failure ([place]) - Primary Patient has chronic respiratory failure and has to use at night and at times during day.     Endocrine   Hypothyroidism   Relevant Orders   TSH Patient is known to have hypothyroidism and is n treatment with levothyroxine 52mcg.  Patient was diagnosed 10 years ago.  Other treatment includes no surgery.  Patient is compliant with medicines and last TSH 6 months ago.  Last TSH was normal.      Genitourinary   BPH (benign prostatic hyperplasia)   Relevant Orders   PSA AN INDIVIDUAL CARE PLAN BPH was established and reinforced today.  The patient's status was assessed using clinical findings on exam, labs, and other diagnostic testing. Patient's success at meeting treatment goals based on disease specific evidence-bassed guidelines and found to be in good control. RECOMMENDATIONS include maintaining present medicines and treatment.      Other   Mixed hyperlipidemia   Relevant Orders   Comprehensive metabolic panel   Lipid panel AN INDIVIDUAL CARE PLAN for hyperlipidemia/ cholesterol was established and reinforced today.  The patient's status was assessed using clinical findings on exam, lab and other diagnostic tests. The patient's disease status was assessed based on evidence-based guidelines and found to be fair controlled. MEDICATIONS were reviewed. SELF MANAGEMENT GOALS have been discussed and patient's success at attaining the goal of low cholesterol was assessed. RECOMMENDATION given include regular exercise 3 days a week and low cholesterol/low fat diet. CLINICAL SUMMARY including written plan to identify barriers unique to the patient due to social or economic  reasons was discussed.    History of lung cancer Patient has history of lung cancer and is being monitored for recurrence        Other Visit Diagnoses     Vitamin D deficiency       Relevant Orders   VITAMIN D 25 Hydroxy (Vit-D Deficiency, Fractures) Patient has vitamin D deficiency, check levels     .    Orders Placed This Encounter  Procedures   VITAMIN D 25 Hydroxy (Vit-D Deficiency, Fractures)   Comprehensive metabolic panel   Lipid panel   TSH   PSA     Follow-up: Return in about 6 months (around 09/29/2022) for provider of choice.  An After Visit Summary was printed and given to the patient.  Reinaldo Meeker, MD [facility] [phone]

## 2022-03-31 ENCOUNTER — Other Ambulatory Visit: Payer: Self-pay | Admitting: Legal Medicine

## 2022-03-31 DIAGNOSIS — N400 Enlarged prostate without lower urinary tract symptoms: Secondary | ICD-10-CM

## 2022-03-31 LAB — COMPREHENSIVE METABOLIC PANEL
ALT: 13 IU/L (ref 0–44)
AST: 21 IU/L (ref 0–40)
Albumin/Globulin Ratio: 1.5 (ref 1.2–2.2)
Albumin: 4 g/dL (ref 3.6–4.6)
Alkaline Phosphatase: 79 IU/L (ref 44–121)
BUN/Creatinine Ratio: 15 (ref 10–24)
BUN: 19 mg/dL (ref 8–27)
Bilirubin Total: 0.4 mg/dL (ref 0.0–1.2)
CO2: 24 mmol/L (ref 20–29)
Calcium: 9.5 mg/dL (ref 8.6–10.2)
Chloride: 102 mmol/L (ref 96–106)
Creatinine, Ser: 1.24 mg/dL (ref 0.76–1.27)
Globulin, Total: 2.6 g/dL (ref 1.5–4.5)
Glucose: 99 mg/dL (ref 70–99)
Potassium: 4.7 mmol/L (ref 3.5–5.2)
Sodium: 139 mmol/L (ref 134–144)
Total Protein: 6.6 g/dL (ref 6.0–8.5)
eGFR: 56 mL/min/{1.73_m2} — ABNORMAL LOW (ref >59)

## 2022-03-31 LAB — LIPID PANEL
Chol/HDL Ratio: 3.4 ratio (ref 0.0–5.0)
Cholesterol, Total: 206 mg/dL — ABNORMAL HIGH (ref 100–199)
HDL: 60 mg/dL (ref >39)
LDL Chol Calc (NIH): 116 mg/dL — ABNORMAL HIGH (ref 0–99)
Triglycerides: 171 mg/dL — ABNORMAL HIGH (ref 0–149)
VLDL Cholesterol Cal: 30 mg/dL (ref 5–40)

## 2022-03-31 LAB — PSA: Prostate Specific Ag, Serum: 4.3 ng/mL — ABNORMAL HIGH (ref 0.0–4.0)

## 2022-03-31 LAB — CARDIOVASCULAR RISK ASSESSMENT

## 2022-03-31 LAB — TSH: TSH: 2.45 u[IU]/mL (ref 0.450–4.500)

## 2022-03-31 NOTE — Progress Notes (Signed)
PSA 4.3 high suggest seeing Dr. Nila Nephew, kidney tests stage 3a, mild, triglycerides high 171 and LDL cholesterol high 116, TSH 2.45 normal lp

## 2022-04-01 ENCOUNTER — Other Ambulatory Visit: Payer: Self-pay

## 2022-04-01 DIAGNOSIS — Z85118 Personal history of other malignant neoplasm of bronchus and lung: Secondary | ICD-10-CM

## 2022-04-14 ENCOUNTER — Telehealth: Payer: Self-pay | Admitting: *Deleted

## 2022-04-14 NOTE — Chronic Care Management (AMB) (Signed)
  Chronic Care Management Note  04/14/2022 Name: Jesus Irwin MRN: [id number] DOB: 02/23/1933  Jesus Irwin is a 88 y.o. year old male who is a primary care patient of Lillard Anes, MD and is actively engaged with the care management team. I reached out to [facility] by phone today to assist with re-scheduling a follow up visit with the RN Case Manager  Follow up plan: Patient spouse [name] DPR on file declines further follow up and engagement by the care management team. Appropriate care team members and provider have been notified via electronic communication. The care management team is available to follow up with the patient after provider conversation with the patient regarding recommendation for care management engagement and subsequent re-referral to the care management team.   [facility]  Direct Dial: [phone]

## 2022-04-15 ENCOUNTER — Ambulatory Visit: Payer: Self-pay

## 2022-04-15 NOTE — Chronic Care Management (AMB) (Signed)
   04/15/2022  Jesus Irwin [birth year] [id number]   Patient lost to follow up. Careguide contacted patient/wife and patient declines any further engagement. Goals completed and case closed.  Tomasa Rand RN, BSN, CEN RN Case Freight forwarder - [facility] [employer] officer Mobile: [phone]

## 2022-06-27 ENCOUNTER — Other Ambulatory Visit: Payer: Self-pay | Admitting: Legal Medicine

## 2022-06-27 DIAGNOSIS — E038 Other specified hypothyroidism: Secondary | ICD-10-CM

## 2022-06-29 ENCOUNTER — Telehealth: Payer: Self-pay | Admitting: Family Medicine

## 2022-06-29 NOTE — Telephone Encounter (Signed)
Called-Spoke with wife, patient in background. Declined AWV scheduling today- will call office when ready to do appt.

## 2022-08-17 ENCOUNTER — Ambulatory Visit (INDEPENDENT_AMBULATORY_CARE_PROVIDER_SITE_OTHER): Payer: Medicare Other

## 2022-08-17 DIAGNOSIS — Z23 Encounter for immunization: Secondary | ICD-10-CM

## 2022-09-24 ENCOUNTER — Other Ambulatory Visit: Payer: Self-pay

## 2022-09-24 DIAGNOSIS — E038 Other specified hypothyroidism: Secondary | ICD-10-CM

## 2022-09-24 MED ORDER — LEVOTHYROXINE SODIUM 75 MCG PO TABS: 75.0000 ug | ORAL_TABLET | Freq: Every day | ORAL | 0 refills | Status: DC

## 2022-09-26 ENCOUNTER — Other Ambulatory Visit: Payer: Self-pay | Admitting: Family Medicine

## 2022-09-26 DIAGNOSIS — E038 Other specified hypothyroidism: Secondary | ICD-10-CM

## 2022-09-27 ENCOUNTER — Ambulatory Visit (INDEPENDENT_AMBULATORY_CARE_PROVIDER_SITE_OTHER): Payer: Medicare Other | Admitting: Physician Assistant

## 2022-09-27 ENCOUNTER — Encounter: Payer: Self-pay | Admitting: Physician Assistant

## 2022-09-27 VITALS — BP 106/62 | HR 93 | Temp 97.5°F | Ht 69.0 in | Wt 148.0 lb

## 2022-09-27 DIAGNOSIS — E038 Other specified hypothyroidism: Secondary | ICD-10-CM | POA: Diagnosis not present

## 2022-09-27 DIAGNOSIS — N400 Enlarged prostate without lower urinary tract symptoms: Secondary | ICD-10-CM | POA: Diagnosis not present

## 2022-09-27 DIAGNOSIS — Z85118 Personal history of other malignant neoplasm of bronchus and lung: Secondary | ICD-10-CM | POA: Diagnosis not present

## 2022-09-27 DIAGNOSIS — E782 Mixed hyperlipidemia: Secondary | ICD-10-CM | POA: Diagnosis not present

## 2022-09-27 NOTE — Progress Notes (Signed)
Subjective:  Patient ID: Jesus Irwin, male    DOB: Jun 16, 1933  Age: 88 y.o. MRN: [id number]  Chief Complaint  Patient presents with   Hypothyroidism    HPI  Pt with history of hypothyroidism - currently on synthroid 29mcg qd - voices no problems or concerns  Pt with history of lung cancer in 2008.  He had been following with Dr Bobby Rumpf with last visit being in 03/2022.  At that time all was stable.  He does use oxygen as needed but otherwise voices no concerns or problems.  He is not using oxygen here in office today  Pt with history of BPH and mildly elevated PSA.  He was evaluated by Dr Nila Nephew about 6 months ago and advised they would monitor conservatively at this time.  He voices no problems or concerns with urination.  Pt with history of mild elevation of lipids.  Currently not on medication - does try to watch diet Due for labwork Current Outpatient Medications on File Prior to Visit  Medication Sig Dispense Refill   Ascorbic Acid (VITAMIN C) 1000 MG tablet Take 1,000 mg by mouth daily.     b complex vitamins capsule Take 1 capsule by mouth daily.     Bilberry 100 MG CAPS Take 1 tablet by mouth daily.     Cholecalciferol (VITAMIN D) 50 MCG (2000 UT) CAPS Take 1 tablet by mouth daily.     IRON, FERROUS SULFATE, PO Take 1 tablet by mouth daily. Reports taking veggie iron     levothyroxine (SYNTHROID) 75 MCG tablet Take 1 tablet (75 mcg total) by mouth daily. 90 tablet 0   Lutein 20 MG CAPS Take 1 tablet by mouth daily.     MULTIPLE VITAMIN PO Take 1 tablet by mouth daily.     Omega-3 Fatty Acids (FISH OIL PO) Take 1,500 capsules by mouth 2 (two) times daily.     Vitamin E 450 MG (1000 UT) CAPS Take 1 tablet by mouth daily.     No current facility-administered medications on file prior to visit.   Past Medical History:  Diagnosis Date   BPH (benign prostatic hyperplasia)    Hypothyroidism    Mixed hyperlipidemia    History reviewed. No pertinent surgical history.  Family  History  Family history unknown: Yes   Social History   Socioeconomic History   Marital status: Married    Spouse name: Ethel   Number of children: 0   Years of education: Not on file   Highest education level: Not on file  Occupational History   Not on file  Tobacco Use   Smoking status: Former    Types: Cigarettes    Quit date: 2006    Years since quitting: 17.9   Smokeless tobacco: Never  Vaping Use   Vaping Use: Never used  Substance and Sexual Activity   Alcohol use: Not Currently   Drug use: Never   Sexual activity: Not on file  Other Topics Concern   Not on file  Social History Narrative   Not on file   Social Determinants of Health   Financial Resource Strain: Not on file  Food Insecurity: No Food Insecurity (06/16/2021)   Hunger Vital Sign    Worried About Running Out of Food in the Last Year: Never true    Ran Out of Food in the Last Year: Never true  Transportation Needs: No Transportation Needs (06/16/2021)   PRAPARE - Hydrologist (Medical): No  Lack of Transportation (Non-Medical): No  Physical Activity: Inactive (06/16/2021)   Exercise Vital Sign    Days of Exercise per Week: 0 days    Minutes of Exercise per Session: 0 min  Stress: Not on file  Social Connections: Not on file    Review of Systems CONSTITUTIONAL: Negative for chills, fatigue, fever, unintentional weight gain and unintentional weight loss.  E/N/T: Negative for ear pain, nasal congestion and sore throat.  CARDIOVASCULAR: Negative for chest pain, dizziness, palpitations and pedal edema.  RESPIRATORY: see HPI GASTROINTESTINAL: Negative for abdominal pain, acid reflux symptoms, constipation, diarrhea, nausea and vomiting.  INTEGUMENTARY: Negative for rash.       Objective:  PHYSICAL EXAM:   VS: BP 106/62 (BP Location: Left Arm, Patient Position: Sitting, Cuff Size: Large)   Pulse 93   Temp (!) 97.5 F (36.4 C) (Temporal)   Ht 5\' 9"  (1.753 m)   Wt  148 lb (67.1 kg)   SpO2 93%   BMI 21.86 kg/m   GEN: Well nourished, well developed, in no acute distress  Cardiac: RRR; no murmurs, rubs, Respiratory:  normal respiratory rate and pattern with no distress - normal breath sounds with no rales, rhonchi, wheezes or rubs MS: no deformity or atrophy  Skin: warm and dry, no rash  Psych: euthymic mood, appropriate affect and demeanor  Lab Results  Component Value Date   WBC 10.1 11/16/2021   HGB 13.1 11/16/2021   HCT 39.5 11/16/2021   PLT 170 11/16/2021   GLUCOSE 99 03/30/2022   CHOL 206 (H) 03/30/2022   TRIG 171 (H) 03/30/2022   HDL 60 03/30/2022   LDLCALC 116 (H) 03/30/2022   ALT 13 03/30/2022   AST 21 03/30/2022   NA 139 03/30/2022   K 4.7 03/30/2022   CL 102 03/30/2022   CREATININE 1.24 03/30/2022   BUN 19 03/30/2022   CO2 24 03/30/2022   TSH 2.450 03/30/2022      Assessment & Plan:   Problem List Items Addressed This Visit       Endocrine   Hypothyroidism   Relevant Orders   TSH Continue meds as directed     Other   Mixed hyperlipidemia - Primary   Relevant Orders   CBC with Differential/Platelet   Comprehensive metabolic panel   Lipid panel Continue to watch diet   History of lung cancer Oxygen use as needed Pt to follow up with Dr Bobby Rumpf as scheduled  BPH Follow up with Dr Nila Nephew as directed  .  No orders of the defined types were placed in this encounter.   Orders Placed This Encounter  Procedures   CBC with Differential/Platelet   Comprehensive metabolic panel   TSH   Lipid panel     Follow-up: Return in about 6 months (around 03/29/2023) for chronic fasting follow up.  An After Visit Summary was printed and given to the patient.  Yetta Flock [facility] [phone]

## 2022-09-28 LAB — TSH: TSH: 2.77 u[IU]/mL (ref 0.450–4.500)

## 2022-09-28 LAB — CBC WITH DIFFERENTIAL/PLATELET
Basophils Absolute: 0 10*3/uL (ref 0.0–0.2)
Basos: 1 %
EOS (ABSOLUTE): 0.3 10*3/uL (ref 0.0–0.4)
Eos: 5 %
Hematocrit: 45.3 % (ref 37.5–51.0)
Hemoglobin: 15.5 g/dL (ref 13.0–17.7)
Immature Grans (Abs): 0 10*3/uL (ref 0.0–0.1)
Immature Granulocytes: 0 %
Lymphocytes Absolute: 1.4 10*3/uL (ref 0.7–3.1)
Lymphs: 23 %
MCH: 34.6 pg — ABNORMAL HIGH (ref 26.6–33.0)
MCHC: 34.2 g/dL (ref 31.5–35.7)
MCV: 101 fL — ABNORMAL HIGH (ref 79–97)
Monocytes Absolute: 0.5 10*3/uL (ref 0.1–0.9)
Monocytes: 9 %
Neutrophils Absolute: 3.8 10*3/uL (ref 1.4–7.0)
Neutrophils: 62 %
Platelets: 203 10*3/uL (ref 150–450)
RBC: 4.48 x10E6/uL (ref 4.14–5.80)
RDW: 12.5 % (ref 11.6–15.4)
WBC: 6.1 10*3/uL (ref 3.4–10.8)

## 2022-09-28 LAB — COMPREHENSIVE METABOLIC PANEL
ALT: 11 IU/L (ref 0–44)
AST: 21 IU/L (ref 0–40)
Albumin/Globulin Ratio: 1.8 (ref 1.2–2.2)
Albumin: 4.2 g/dL (ref 3.7–4.7)
Alkaline Phosphatase: 88 IU/L (ref 44–121)
BUN/Creatinine Ratio: 14 (ref 10–24)
BUN: 14 mg/dL (ref 8–27)
Bilirubin Total: 0.7 mg/dL (ref 0.0–1.2)
CO2: 27 mmol/L (ref 20–29)
Calcium: 9.4 mg/dL (ref 8.6–10.2)
Chloride: 101 mmol/L (ref 96–106)
Creatinine, Ser: 1.03 mg/dL (ref 0.76–1.27)
Globulin, Total: 2.4 g/dL (ref 1.5–4.5)
Glucose: 92 mg/dL (ref 70–99)
Potassium: 4.7 mmol/L (ref 3.5–5.2)
Sodium: 141 mmol/L (ref 134–144)
Total Protein: 6.6 g/dL (ref 6.0–8.5)
eGFR: 70 mL/min/{1.73_m2} (ref >59)

## 2022-09-28 LAB — LIPID PANEL
Chol/HDL Ratio: 3.2 ratio (ref 0.0–5.0)
Cholesterol, Total: 200 mg/dL — ABNORMAL HIGH (ref 100–199)
HDL: 63 mg/dL (ref >39)
LDL Chol Calc (NIH): 120 mg/dL — ABNORMAL HIGH (ref 0–99)
Triglycerides: 95 mg/dL (ref 0–149)
VLDL Cholesterol Cal: 17 mg/dL (ref 5–40)

## 2022-09-28 LAB — CARDIOVASCULAR RISK ASSESSMENT

## 2022-12-23 ENCOUNTER — Other Ambulatory Visit: Payer: Self-pay | Admitting: Physician Assistant

## 2022-12-23 DIAGNOSIS — E038 Other specified hypothyroidism: Secondary | ICD-10-CM

## 2022-12-23 MED ORDER — LEVOTHYROXINE SODIUM 75 MCG PO TABS: 75.0000 ug | ORAL_TABLET | Freq: Every day | ORAL | 0 refills | Status: DC

## 2023-03-28 ENCOUNTER — Other Ambulatory Visit: Payer: Self-pay

## 2023-03-28 DIAGNOSIS — E038 Other specified hypothyroidism: Secondary | ICD-10-CM

## 2023-03-28 MED ORDER — LEVOTHYROXINE SODIUM 75 MCG PO TABS: 75.0000 ug | ORAL_TABLET | Freq: Every day | ORAL | 0 refills | Status: DC

## 2023-05-26 ENCOUNTER — Telehealth: Payer: Self-pay

## 2023-05-26 NOTE — Telephone Encounter (Signed)
Error

## 2023-07-11 ENCOUNTER — Telehealth: Payer: Self-pay

## 2023-07-11 NOTE — Telephone Encounter (Signed)
Patient called and niece called and stated patient ws recently in the hospital and was diagnose with cancer in both lungs and live. Hospital release him with home health care bit since he has not seen his PCP in 6 months they would need provider to sign off on home health. Patient is schedule to see Dr.Lewis tomorrow for hospital follow up  Called patient niece back and she stated she has already spoke with provider and was made aware that Dr. Melvyn Neth would be able to sign off on home health order, so they will just talk with him about it tomorrow.

## 2023-07-12 ENCOUNTER — Telehealth: Payer: Self-pay

## 2023-07-12 ENCOUNTER — Telehealth: Payer: Self-pay | Admitting: Oncology

## 2023-07-12 ENCOUNTER — Inpatient Hospital Stay: Payer: Medicare Other

## 2023-07-12 ENCOUNTER — Inpatient Hospital Stay: Payer: Medicare Other | Attending: Oncology | Admitting: Oncology

## 2023-07-12 VITALS — BP 107/64 | HR 106 | Temp 97.6°F | Resp 18 | Ht 69.0 in | Wt 115.7 lb

## 2023-07-12 DIAGNOSIS — C787 Secondary malignant neoplasm of liver and intrahepatic bile duct: Secondary | ICD-10-CM

## 2023-07-12 NOTE — Telephone Encounter (Signed)
[facility] called and stated that they need provider to sign off on the home health orders that they got from the hospital. I explain to her that the patient is seeing Dr. Melvyn Neth today and he will be able to sign off on the order and the daughter is made aware of this as well.

## 2023-07-12 NOTE — Progress Notes (Unsigned)
[facility]  [address] [place],  [place]  [address] [phone]  Clinic Day:  03/23/2022  Referring physician: Marianne Sofia, PA-C   HISTORY OF PRESENT ILLNESS:  The patient is a 89 y.o. 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  For the past 16 years, all follow-up scans showed no evidence of disease recurrence.  Unfortunately, over these past few months, this gentleman has had progressive weight loss, shortness of breath, and failure to thrive.  Due to the progressive decline in his health, his family took him to the emergency room for further evaluation.  While there, CT scans were done, which revealed a new right lower lobe lung mass.  Furthermore, there was a cystic pancreatic lesion.  The most concerning finding was that he had extensive liver metastasis.  He comes in today to go over his CT scan images and their implications.  The patient admits that his daily quality of life has significantly fallen since his wife passed away approximately 2 months ago.  His appetite has essentially been nonexistent over these past few weeks.  PHYSICAL EXAM:  Blood pressure 107/64, pulse (!) 106, temperature 97.6 F (36.4 C), resp. rate 18, height 5\' 9"  (1.753 m), weight 115 lb 11.2 oz (52.5 kg), SpO2 92%. Wt Readings from Last 3 Encounters:  07/12/23 115 lb 11.2 oz (52.5 kg)  09/27/22 148 lb (67.1 kg)  03/30/22 141 lb (64 kg)   Body mass index is 17.09 kg/m. Performance status (ECOG): 1 - Symptomatic but completely ambulatory Physical Exam Constitutional:      Appearance: Normal appearance. He is not ill-appearing.     Comments: A very thin, cachectic gentleman with noticeable weight loss  HENT:     Mouth/Throat:     Mouth: Mucous membranes are moist.     Pharynx: Oropharynx is clear. No oropharyngeal exudate or posterior oropharyngeal erythema.  Cardiovascular:     Rate and  Rhythm: Normal rate and regular rhythm.     Heart sounds: No murmur heard.    No friction rub. No gallop.  Pulmonary:     Effort: Pulmonary effort is normal. No respiratory distress.     Breath sounds: Examination of the right-upper field reveals decreased breath sounds. Examination of the right-middle field reveals decreased breath sounds. Examination of the right-lower field reveals decreased breath sounds. Decreased breath sounds present. No wheezing, rhonchi or rales.  Abdominal:     General: Bowel sounds are normal. There is no distension.     Palpations: Abdomen is soft. There is no mass.     Tenderness: There is no abdominal tenderness.  Musculoskeletal:        General: No swelling.     Right lower leg: No edema.     Left lower leg: No edema.  Lymphadenopathy:     Cervical: No cervical adenopathy.     Upper Body:     Right upper body: No supraclavicular or axillary adenopathy.     Left upper body: No supraclavicular or axillary adenopathy.     Lower Body: No right inguinal adenopathy. No left inguinal adenopathy.  Skin:    General: Skin is warm.     Coloration: Skin is not jaundiced.     Findings: No lesion or rash.  Neurological:     General: No focal deficit present.     Mental Status: He is alert and oriented to person, place, and time.  Mental status is at baseline.  Psychiatric:        Mood and Affect: Mood normal.        Behavior: Behavior normal.        Thought Content: Thought content normal.   SCANS: CT scans of his chest/abdomen/pelvis revealed the following: IMPRESSION: 1. Negative for acute pulmonary embolus. 2. Chronic volume loss right hemithorax with chronic right pleural effusion and thickening as well as paramediastinal fibrosis and post therapy change, however interim development of right posterior lower lung mass concerning for lung carcinoma or metastatic lesion. New spiculated left apical pulmonary nodule also suspicious for pulmonary  neoplasm/metastatic disease. 3. Interim development of extensive hypodense liver nodules consistent with metastatic disease. 4. Large indeterminate cystic appearing mass that appears to be arising from the pancreatic body, measuring 6.5 cm. Nonemergent MRI evaluation may be performed if deemed clinically appropriate. 5. 3.2 cm proximal infrarenal abdominal aortic aneurysm. Recommend follow-up every 3 years. (Ref.: J Vasc Surg. 2018; 67:2-77 and J Am Coll Radiol 2013;10(10):789-794.) Aortic Atherosclerosis (ICD10-I70.0) and Emphysema (ICD10-J43.9).  ASSESSMENT & PLAN:  Assessment/Plan:  An 89 y.o. male with a history of stage IIIA squamous cell lung cancer, who completed definitive chemoradiation in 2008.  In clinic today, I went over all of his CT scan images with him, for which he could see that he has developed a new right lung mass, as well as a large cystic pancreatic lesion.  However, the most concerning finding is that his liver is heavily inundated with metastatic disease.  From my vantage point, at least 75-80% of his liver is overtaken by metastatic disease from some source.  Based upon this finding, I explained to the patient that his life expectancy, without any form of treatment, is likely less than 4-6 weeks.  When considering how poor his quality of life has been and how he likely cannot tolerate any form of palliative chemotherapy at his current health and age, the patient has ultimately elected to bring in hospice for his palliative/terminal care.  I will work with hospice in providing the patient whatever assistance he needs to maintain his daily quality of life for as long as possible.  No future follow-up visits will be scheduled.  However, the patient and his family know to contact our office over these next weeks if they have additional questions or concerns regarding his terminal care.  The patient understands all the plans discussed today and is in agreement with them.  Georgiana Spillane  Kirby Funk, MD

## 2023-07-12 NOTE — Telephone Encounter (Signed)
07/12/2023  Patient was unreachable by phone

## 2023-07-13 DIAGNOSIS — C787 Secondary malignant neoplasm of liver and intrahepatic bile duct: Secondary | ICD-10-CM | POA: Insufficient documentation

## 2023-08-19 DEATH — deceased
